# Patient Record
Sex: Female | Born: 1990 | Race: Black or African American | Hispanic: No | Marital: Single | Smoking: Former smoker
Health system: Southern US, Community
[De-identification: ages and names within clinical notes are randomized; demographics above are authoritative.]

## PROBLEM LIST (undated history)

## (undated) DIAGNOSIS — Z8619 Personal history of other infectious and parasitic diseases: Secondary | ICD-10-CM

## (undated) DIAGNOSIS — D649 Anemia, unspecified: Secondary | ICD-10-CM

## (undated) HISTORY — DX: Personal history of other infectious and parasitic diseases: Z86.19

## (undated) HISTORY — DX: Anemia, unspecified: D64.9

---

## 2017-08-06 DIAGNOSIS — Z8619 Personal history of other infectious and parasitic diseases: Secondary | ICD-10-CM

## 2017-08-06 HISTORY — PX: THERAPEUTIC ABORTION: SHX798

## 2017-08-06 HISTORY — DX: Personal history of other infectious and parasitic diseases: Z86.19

## 2017-08-14 ENCOUNTER — Encounter (HOSPITAL_COMMUNITY): Payer: Self-pay

## 2017-08-14 ENCOUNTER — Emergency Department (HOSPITAL_COMMUNITY): Payer: BLUE CROSS/BLUE SHIELD

## 2017-08-14 ENCOUNTER — Emergency Department (HOSPITAL_COMMUNITY)
Admission: EM | Admit: 2017-08-14 | Discharge: 2017-08-14 | Disposition: A | Discharge status: Home / Self Care | Payer: BLUE CROSS/BLUE SHIELD | Attending: Emergency Medicine | Admitting: Emergency Medicine

## 2017-08-14 DIAGNOSIS — O2341 Unspecified infection of urinary tract in pregnancy, first trimester: Secondary | ICD-10-CM

## 2017-08-14 DIAGNOSIS — N390 Urinary tract infection, site not specified: Secondary | ICD-10-CM

## 2017-08-14 DIAGNOSIS — Z3A09 9 weeks gestation of pregnancy: Secondary | ICD-10-CM | POA: Insufficient documentation

## 2017-08-14 DIAGNOSIS — R1030 Lower abdominal pain, unspecified: Secondary | ICD-10-CM | POA: Diagnosis present

## 2017-08-14 LAB — URINALYSIS, ROUTINE W REFLEX MICROSCOPIC
Bilirubin Urine: NEGATIVE
Glucose, UA: NEGATIVE mg/dL
Hgb urine dipstick: NEGATIVE
Ketones, ur: 80 mg/dL — AB
Nitrite: POSITIVE — AB
PH: 5 (ref 5.0–8.0)
Protein, ur: NEGATIVE mg/dL
SPECIFIC GRAVITY, URINE: 1.025 (ref 1.005–1.030)

## 2017-08-14 LAB — COMPREHENSIVE METABOLIC PANEL WITH GFR
ALT: 15 U/L (ref 14–54)
AST: 19 U/L (ref 15–41)
Albumin: 3.6 g/dL (ref 3.5–5.0)
Alkaline Phosphatase: 49 U/L (ref 38–126)
Anion gap: 7 (ref 5–15)
BUN: 10 mg/dL (ref 6–20)
CO2: 20 mmol/L — ABNORMAL LOW (ref 22–32)
Calcium: 8.9 mg/dL (ref 8.9–10.3)
Chloride: 110 mmol/L (ref 101–111)
Creatinine, Ser: 0.51 mg/dL (ref 0.44–1.00)
GFR calc Af Amer: >60 mL/min
GFR calc non Af Amer: >60 mL/min
Glucose, Bld: 75 mg/dL (ref 65–99)
Potassium: 3.5 mmol/L (ref 3.5–5.1)
Sodium: 137 mmol/L (ref 135–145)
Total Bilirubin: 0.2 mg/dL — ABNORMAL LOW (ref 0.3–1.2)
Total Protein: 7.4 g/dL (ref 6.5–8.1)

## 2017-08-14 LAB — CBC
HEMATOCRIT: 34 % — AB (ref 36.0–46.0)
Hemoglobin: 11.7 g/dL — ABNORMAL LOW (ref 12.0–15.0)
MCH: 32.9 pg (ref 26.0–34.0)
MCHC: 34.4 g/dL (ref 30.0–36.0)
MCV: 95.5 fL (ref 78.0–100.0)
PLATELETS: 284 10*3/uL (ref 150–400)
RBC: 3.56 MIL/uL — ABNORMAL LOW (ref 3.87–5.11)
RDW: 12.7 % (ref 11.5–15.5)
WBC: 7.3 10*3/uL (ref 4.0–10.5)

## 2017-08-14 LAB — WET PREP, GENITAL
Sperm: NONE SEEN
Trich, Wet Prep: NONE SEEN
Yeast Wet Prep HPF POC: NONE SEEN

## 2017-08-14 LAB — HCG, QUANTITATIVE, PREGNANCY: hCG, Beta Chain, Quant, S: 266960 m[IU]/mL — ABNORMAL HIGH

## 2017-08-14 LAB — LIPASE, BLOOD: Lipase: 25 U/L (ref 11–51)

## 2017-08-14 LAB — ABO/RH: ABO/RH(D): O POS

## 2017-08-14 MED ORDER — CEPHALEXIN 500 MG PO CAPS
500.0000 mg | ORAL_CAPSULE | Freq: Once | ORAL | Status: AC
  Administered 2017-08-14: 500 mg via ORAL
  Filled 2017-08-14: qty 1

## 2017-08-14 MED ORDER — CEPHALEXIN 500 MG PO CAPS: 500.0000 mg | ORAL_CAPSULE | Freq: Two times a day (BID) | ORAL | 0 refills | Status: AC

## 2017-08-14 NOTE — ED Provider Notes (Signed)
[facility]-EMERGENCY DEPT Provider Note   CSN: [id number] Arrival date & time: 08/14/17  1204     History   Chief Complaint Chief Complaint  Patient presents with  . Abdominal Pain    HPI Christine Martin is a 27 y.o. female who presents today for evaluation of abdominal pain.  She reports that her pain began last night when she was sitting on the couch.  She denies any recent trauma.  She took a home pregnancy test that was of, and went to urgent care yesterday where she reportedly had another positive.  She reports that her last period was approximately 1 week before Christmas.  She reports that her abdominal pain is intermittent, and is currently feeling better.  Her pain is below her bellybutton.  She has not found anything that makes it better or worse.  She reports that she had to leave work today because of the pain.  Denies nausea, vomiting, diarrhea, no vaginal bleeding.  No recent fevers or chills, denies dysuria, hematuria, increased urgency, or frequency.  HPI  History reviewed. No pertinent past medical history.  There are no active problems to display for this patient.   History reviewed. No pertinent surgical history.  OB History    Gravida Para Term Preterm AB Living   1             SAB TAB Ectopic Multiple Live Births                   Home Medications    Prior to Admission medications   Medication Sig Start Date End Date Taking? Authorizing Provider  cephALEXin (KEFLEX) 500 MG capsule Take 1 capsule (500 mg total) by mouth 2 (two) times daily for 7 days. 08/14/17 08/21/17  Cristina Gong, PA-C    Family History No family history on file.  Social History Social History   Tobacco Use  . Smoking status: Never Smoker  . Smokeless tobacco: Never Used  Substance Use Topics  . Alcohol use: Yes    Frequency: Never    Comment: social  . Drug use: No     Allergies   Patient has no known allergies.   Review of Systems Review  of Systems  Constitutional: Negative for chills and fever.  Gastrointestinal: Positive for abdominal pain. Negative for abdominal distention, diarrhea, nausea and vomiting.  Genitourinary: Negative for difficulty urinating, dysuria, flank pain, frequency, urgency, vaginal bleeding, vaginal discharge and vaginal pain.  Neurological: Negative for headaches.  All other systems reviewed and are negative.    Physical Exam Updated Vital Signs BP 123/78   Pulse 97   Temp 98.3 F (36.8 C) (Oral)   Resp 18   Ht 5' (1.524 m)   Wt 49.9 kg (110 lb)   LMP  (LMP Unknown)   SpO2 100%   BMI 21.48 kg/m   Physical Exam  Constitutional: She appears well-developed and well-nourished. No distress.  HENT:  Head: Normocephalic and atraumatic.  Mouth/Throat: Oropharynx is clear and moist.  Eyes: Conjunctivae are normal.  Neck: Neck supple.  Cardiovascular: Normal rate and regular rhythm.  No murmur heard. Pulmonary/Chest: Effort normal and breath sounds normal. No respiratory distress.  Abdominal: Soft. Normal appearance and bowel sounds are normal. She exhibits no distension and no mass. There is no tenderness. There is no rigidity, no rebound, no guarding and no CVA tenderness.  Uterus not palpable above the pelvic brim.   Genitourinary: Uterus is enlarged. Uterus is not tender. Cervix  exhibits no motion tenderness, no discharge and no friability. Right adnexum displays no mass, no tenderness and no fullness. Left adnexum displays no mass, no tenderness and no fullness.  Musculoskeletal: She exhibits no edema.  Neurological: She is alert.  Skin: Skin is warm and dry.  Psychiatric: She has a normal mood and affect.  Nursing note and vitals reviewed.    ED Treatments / Results  Labs (all labs ordered are listed, but only abnormal results are displayed) Labs Reviewed  WET PREP, GENITAL - Abnormal; Notable for the following components:      Result Value   Clue Cells Wet Prep HPF POC PRESENT  (*)    WBC, Wet Prep HPF POC MANY (*)    All other components within normal limits  COMPREHENSIVE METABOLIC PANEL - Abnormal; Notable for the following components:   CO2 20 (*)    Total Bilirubin 0.2 (*)    All other components within normal limits  CBC - Abnormal; Notable for the following components:   RBC 3.56 (*)    Hemoglobin 11.7 (*)    HCT 34.0 (*)    All other components within normal limits  URINALYSIS, ROUTINE W REFLEX MICROSCOPIC - Abnormal; Notable for the following components:   Color, Urine AMBER (*)    APPearance CLOUDY (*)    Ketones, ur 80 (*)    Nitrite POSITIVE (*)    Leukocytes, UA SMALL (*)    Bacteria, UA MANY (*)    Squamous Epithelial / LPF 6-30 (*)    All other components within normal limits  HCG, QUANTITATIVE, PREGNANCY - Abnormal; Notable for the following components:   hCG, Beta Chain, Quant, S 266,960 (*)    All other components within normal limits  URINE CULTURE  LIPASE, BLOOD  HIV ANTIBODY (ROUTINE TESTING)  ABO/RH  GC/CHLAMYDIA PROBE AMP ([place]) NOT AT [facility]    EKG  EKG Interpretation None       Radiology US Ob Comp < 14 Wks  Result Date: 08/14/2017 CLINICAL DATA:  Pelvic pain EXAM: OBSTETRIC <14 WK ULTRASOUND TECHNIQUE: Transabdominal ultrasound was performed for evaluation of the gestation as well as the maternal uterus and adnexal regions. COMPARISON:  None. FINDINGS: Intrauterine gestational sac: Visualized Yolk sac:  Visualized Embryo:  Visualized Cardiac Activity: Visualized Heart Rate: 178 bpm CRL:   28 mm   9 w 4 d                  [place] EDC: March 15, 2018 Subchorionic hemorrhage:  None visualized. Maternal uterus/adnexae: Cervical os appears closed. Right ovary appears normal. Left ovary is not appreciable. There is no extrauterine pelvic or adnexal mass evident. No free pelvic fluid. IMPRESSION: Single live intrauterine gestation with estimated gestational age of 9+ weeks. No subchorionic hemorrhage evident. Left ovary could  not be visualized. Maternal extrauterine pelvic structures otherwise appear normal. Electronically Signed   By: Bretta Bang III M.D.   On: 08/14/2017 21:07    Procedures Procedures (including critical care time)  Medications Ordered in ED Medications  cephALEXin (KEFLEX) capsule 500 mg (500 mg Oral Given 08/14/17 2251)     Initial Impression / Assessment and Plan / ED Course  I have reviewed the triage vital signs and the nursing notes.  Pertinent labs & imaging results that were available during my care of the patient were reviewed by me and considered in my medical decision making (see chart for details).  Clinical Course as of Aug 16 1435  Sat Aug 14, 2017  1810 Attempted to see patient, in bathroom.   [EH]  2004 Spoke with lab, reports hcg is positive, waiting to see final number.   [EH]  2010 Lab reports that number is very high, will need to run dilution on hcg   [EH]    Clinical Course User Index [EH] Cristina GongHammond, Jeannifer Drakeford W, PA-C   Nadeen LandauKiara Cousins presents today for evaluation of abdominal pain.  She found out yesterday that she is pregnant.  She has not had any imaging, or lab work obtained, no history of ultrasounds confirming intrauterine gestation.  Labs were obtained and reviewed.  Ultrasound was ordered which showed a single gestation intrauterine pregnancy with no significant pelvic free fluid.  UA obtained consistent with urinary tract infection, nitrite positive.  Pelvic exam was performed, no adnexal tenderness, wet prep obtained, swabs for gonorrhea chlamydia were taken.  Patient has BV, however is asymptomatic, as she is pregnant after discussion with Dr. Donnald GarrePfeiffer will not treat BV.  Patient given her first dose of Keflex in the emergency room, along with prescription to fill at home.  Urine culture ordered.  She is afebrile, no CVA tenderness, I do not suspect pyelonephritis.  She was counseled on dietary and life style advice for pregnancy, given information on first  trimester pregnancy, along with urinary tract infection and pregnancy.  She was given the option twice questions, all of which were answered to the best my ability.  She does not have any vaginal spotting or bleeding.  She was given strict return precautions, and stated her understanding.  She was given OB/GYN follow-up with the women's clinic.  Given UTI with normal pelvic ultrasound in pregnancy with hemodynamic stability and reassuring labs additional work up is not indicated.     At this time there does not appear to be any evidence of an acute emergency medical condition and the patient appears stable for discharge with appropriate outpatient follow up.Diagnosis was discussed with patient who verbalizes understanding and is agreeable to discharge.    Final Clinical Impressions(s) / ED Diagnoses   Final diagnoses:  Lower abdominal pain  Lower urinary tract infectious disease  Urinary tract infection in mother during first trimester of pregnancy  [redacted] weeks gestation of pregnancy    ED Discharge Orders        Ordered    cephALEXin (KEFLEX) 500 MG capsule  2 times daily     08/14/17 2206       Cristina GongHammond, Vernida Mcnicholas W, [place]PA-C 08/15/17 1437    Arby BarrettePfeiffer, Marcy, MD 08/15/17 1732

## 2017-08-14 NOTE — Discharge Instructions (Signed)
You may have diarrhea from the antibiotics.  It is very important that you continue to take the antibiotics even if you get diarrhea unless a medical professional tells you that you may stop taking them.  If you stop too early the bacteria you are being treated for will become stronger and you may need different, more powerful antibiotics that have more side effects and worsening diarrhea.  Please stay well hydrated and consider probiotics as they may decrease the severity of your diarrhea.    You have been tested for gonorrhea and chlamydia today.  If those results come back positive you will need treatment.  You may follow up with the health department or your OB/GYN.    Please make sure that you do not drink any alcohol, start taking prenatal vitamins.

## 2017-08-14 NOTE — ED Notes (Addendum)
Pt had drawn for labs:  Red  Blue Gold Lavender Lt green  Dark green

## 2017-08-14 NOTE — ED Triage Notes (Addendum)
Pt with abdominal pain x 3 days.  No n/v/d.  No fever.  No change in urination.  Pt states found out from urgent care yesterday that she is pregnant.  No period since December.  No vaginal bleeding.  Did not do labs or testing yesterday.

## 2017-08-15 ENCOUNTER — Encounter (HOSPITAL_COMMUNITY): Payer: Self-pay | Admitting: Physician Assistant

## 2017-08-15 LAB — HIV ANTIBODY (ROUTINE TESTING W REFLEX): HIV Screen 4th Generation wRfx: NONREACTIVE

## 2017-08-16 ENCOUNTER — Telehealth: Payer: Self-pay | Admitting: Student

## 2017-08-16 DIAGNOSIS — A749 Chlamydial infection, unspecified: Secondary | ICD-10-CM

## 2017-08-16 LAB — GC/CHLAMYDIA PROBE AMP (~~LOC~~) NOT AT ARMC
Chlamydia: POSITIVE — AB
Neisseria Gonorrhea: NEGATIVE

## 2017-08-16 MED ORDER — AZITHROMYCIN 500 MG PO TABS: 1000.0000 mg | ORAL_TABLET | Freq: Once | ORAL | 0 refills | Status: AC

## 2017-08-16 NOTE — Telephone Encounter (Addendum)
Christine LandauKiara Wrobleski tested positive for  Chlamydia. Patient was called by RN and allergies and pharmacy confirmed. Rx sent to pharmacy of choice.   Judeth HornLawrence, Sharlie Shreffler, NP 08/16/2017 3:12 PM       ----- Message from Kathe BectonLori S Berdik, RN sent at 08/16/2017  2:56 PM EST ----- This patient tested positive for :  Chlamydia  She ::"has NKDA", I have informed the patient of her results and confirmed her pharmacy is correct in her chart. Please send Rx.   Thank you,   Kathe BectonBerdik, Lori S, RN   [facility]

## 2017-08-17 LAB — URINE CULTURE: Culture: >=100000 — AB

## 2017-08-18 ENCOUNTER — Telehealth: Payer: Self-pay | Admitting: Emergency Medicine

## 2017-08-18 NOTE — Telephone Encounter (Signed)
Post ED Visit - Positive Culture Follow-up  Culture report reviewed by antimicrobial stewardship pharmacist:  []  Enzo BiNathan Batchelder, Pharm.D. []  Celedonio MiyamotoJeremy Frens, Pharm.D., BCPS AQ-ID []  Garvin FilaMike Maccia, Pharm.D., BCPS [x]  Georgina PillionElizabeth Martin, Pharm.D., BCPS []  IvinsMinh Pham, [address]Pharm.D., BCPS, AAHIVP []  Estella HuskMichelle Turner, Pharm.D., BCPS, AAHIVP []  Lysle Pearlachel Rumbarger, PharmD, BCPS []  Blake DivineShannon Parkey, PharmD []  Pollyann SamplesAndy Johnston, PharmD, BCPS  Positive urine culture Treated with cephalexin, organism sensitive to the same and no further patient follow-up is required at this time.  Berle MullMiller, Lodema Parma 08/18/2017, 10:51 AM

## 2017-12-28 ENCOUNTER — Other Ambulatory Visit: Payer: Self-pay

## 2017-12-28 ENCOUNTER — Encounter (HOSPITAL_COMMUNITY): Payer: Self-pay

## 2017-12-28 ENCOUNTER — Emergency Department (HOSPITAL_COMMUNITY)
Admission: EM | Admit: 2017-12-28 | Discharge: 2017-12-28 | Disposition: A | Discharge status: Home / Self Care | Payer: Self-pay | Attending: Emergency Medicine | Admitting: Emergency Medicine

## 2017-12-28 ENCOUNTER — Emergency Department (HOSPITAL_COMMUNITY): Payer: Self-pay

## 2017-12-28 DIAGNOSIS — R1030 Lower abdominal pain, unspecified: Secondary | ICD-10-CM | POA: Insufficient documentation

## 2017-12-28 DIAGNOSIS — R102 Pelvic and perineal pain: Secondary | ICD-10-CM | POA: Insufficient documentation

## 2017-12-28 DIAGNOSIS — Z3491 Encounter for supervision of normal pregnancy, unspecified, first trimester: Secondary | ICD-10-CM

## 2017-12-28 DIAGNOSIS — Z3201 Encounter for pregnancy test, result positive: Secondary | ICD-10-CM | POA: Insufficient documentation

## 2017-12-28 DIAGNOSIS — R350 Frequency of micturition: Secondary | ICD-10-CM | POA: Insufficient documentation

## 2017-12-28 DIAGNOSIS — O9989 Other specified diseases and conditions complicating pregnancy, childbirth and the puerperium: Secondary | ICD-10-CM | POA: Insufficient documentation

## 2017-12-28 DIAGNOSIS — Z3A01 Less than 8 weeks gestation of pregnancy: Secondary | ICD-10-CM | POA: Insufficient documentation

## 2017-12-28 LAB — COMPREHENSIVE METABOLIC PANEL
ALT: 14 U/L (ref 0–44)
ANION GAP: 9 (ref 5–15)
AST: 29 U/L (ref 15–41)
Albumin: 4.5 g/dL (ref 3.5–5.0)
Alkaline Phosphatase: 49 U/L (ref 38–126)
BILIRUBIN TOTAL: 2.3 mg/dL — AB (ref 0.3–1.2)
BUN: 8 mg/dL (ref 6–20)
CALCIUM: 9.5 mg/dL (ref 8.9–10.3)
CO2: 23 mmol/L (ref 22–32)
Chloride: 106 mmol/L (ref 98–111)
Creatinine, Ser: 0.53 mg/dL (ref 0.44–1.00)
GFR calc non Af Amer: >60 mL/min (ref >60)
Glucose, Bld: 82 mg/dL (ref 70–99)
Potassium: 4.3 mmol/L (ref 3.5–5.1)
SODIUM: 138 mmol/L (ref 135–145)
TOTAL PROTEIN: 8.5 g/dL — AB (ref 6.5–8.1)

## 2017-12-28 LAB — URINALYSIS, ROUTINE W REFLEX MICROSCOPIC
BACTERIA UA: NONE SEEN
Bilirubin Urine: NEGATIVE
Glucose, UA: NEGATIVE mg/dL
Ketones, ur: 20 mg/dL — AB
Leukocytes, UA: NEGATIVE
NITRITE: NEGATIVE
Protein, ur: NEGATIVE mg/dL
SPECIFIC GRAVITY, URINE: 1.012 (ref 1.005–1.030)
pH: 5 (ref 5.0–8.0)

## 2017-12-28 LAB — CBC
HEMATOCRIT: 40.9 % (ref 36.0–46.0)
Hemoglobin: 14.2 g/dL (ref 12.0–15.0)
MCH: 33.3 pg (ref 26.0–34.0)
MCHC: 34.7 g/dL (ref 30.0–36.0)
MCV: 96 fL (ref 78.0–100.0)
Platelets: 343 10*3/uL (ref 150–400)
RBC: 4.26 MIL/uL (ref 3.87–5.11)
RDW: 12.6 % (ref 11.5–15.5)
WBC: 6 10*3/uL (ref 4.0–10.5)

## 2017-12-28 LAB — WET PREP, GENITAL
CLUE CELLS WET PREP: NONE SEEN
SPERM: NONE SEEN
TRICH WET PREP: NONE SEEN
YEAST WET PREP: NONE SEEN

## 2017-12-28 LAB — LIPASE, BLOOD: Lipase: 23 U/L (ref 11–51)

## 2017-12-28 LAB — ABO/RH: ABO/RH(D): O POS

## 2017-12-28 LAB — HCG, QUANTITATIVE, PREGNANCY: hCG, Beta Chain, Quant, S: 57739 m[IU]/mL — ABNORMAL HIGH (ref <5)

## 2017-12-28 NOTE — ED Provider Notes (Signed)
[facility]-EMERGENC[facility] DEPT Provider Note   CSN: [id number] Arrival date & time: 12/28/17  4098     History   Chief Complaint Chief Complaint  Patient presents with  . Abdominal Pain  . Urinary Frequency    HPI Christine Martin is a 27 y.o. female.  27 year old female who presents with lower abdominal pain and urinary frequency.  For the past 2 days, she has had an intermittent lower abdominal pain that radiates to her lower back as well as increased urinary frequency.  She denies any dysuria or hematuria.  She had some mild vaginal spotting a few days ago.  Last menstrual period was at the first week of May.  She took a home pregnancy test yesterday that was positive.  She denies any vaginal discharge.  She had an episode of vomiting last week but no nausea or vomiting recently.  No cough/cold symptoms.  She currently uses marijuana occasionally.  She started taking prenatal vitamins a few days ago.  The history is provided by the patient.  Abdominal Pain   Associated symptoms include frequency.  Urinary Frequency  Associated symptoms include abdominal pain.    History reviewed. No pertinent past medical history.  There are no active problems to display for this patient.   History reviewed. No pertinent surgical history.   OB History    Gravida  1   Para      Term      Preterm      AB      Living        SAB      TAB      Ectopic      Multiple      Live Births               Home Medications    Prior to Admission medications   Medication Sig Start Date End Date Taking? Authorizing Provider  Prenatal Vit-Fe Fumarate-FA (PRENATAL MULTIVITAMIN) TABS tablet Take 1 tablet by mouth daily.   Yes [provider]    Family History History reviewed. No pertinent family history.  Social History Social History   Tobacco Use  . Smoking status: Never Smoker  . Smokeless tobacco: Never Used  Substance Use Topics  . Alcohol  use: Yes    Frequency: Never    Comment: social  . Drug use: No     Allergies   Patient has no known allergies.   Review of Systems Review of Systems  Gastrointestinal: Positive for abdominal pain.  Genitourinary: Positive for frequency.   All other systems reviewed and are negative except that which was mentioned in HPI   Physical Exam Updated Vital Signs BP 120/79 (BP Location: Left Arm)   Pulse 76   Temp 98.1 F (36.7 C) (Oral)   Resp 14   Ht 5' (1.524 m)   Wt 49.6 kg (109 lb 6 oz)   LMP 11/06/2017 (Approximate)   SpO2 100%   BMI 21.36 kg/m   Physical Exam  Constitutional: She is oriented to person, place, and time. She appears well-developed and well-nourished. No distress.  HENT:  Head: Normocephalic and atraumatic.  Moist mucous membranes  Eyes: Conjunctivae are normal.  Neck: Neck supple.  Cardiovascular: Normal rate, regular rhythm and normal heart sounds.  No murmur heard. Pulmonary/Chest: Effort normal and breath sounds normal.  Abdominal: Soft. Bowel sounds are normal. She exhibits no distension. There is no tenderness.  Genitourinary: Cervix exhibits friability. Cervix exhibits no motion tenderness. Right  adnexum displays no tenderness. Left adnexum displays no tenderness. No tenderness or bleeding in the vagina. Vaginal discharge found.  Genitourinary Comments: Small amount of white discharge in vaginal vault, mildly friable cervix; no adnexal or cervical motion tenderness  Musculoskeletal: She exhibits no edema.  Neurological: She is alert and oriented to person, place, and time.  Fluent speech  Skin: Skin is warm and dry.  Psychiatric: She has a normal mood and affect. Judgment normal.  Nursing note and vitals reviewed.    ED Treatments / Results  Labs (all labs ordered are listed, but only abnormal results are displayed) Labs Reviewed  WET PREP, GENITAL - Abnormal; Notable for the following components:      Result Value   WBC, Wet Prep HPF  POC MANY (*)    All other components within normal limits  COMPREHENSIVE METABOLIC PANEL - Abnormal; Notable for the following components:   Total Protein 8.5 (*)    Total Bilirubin 2.3 (*)    All other components within normal limits  URINALYSIS, ROUTINE W REFLEX MICROSCOPIC - Abnormal; Notable for the following components:   Hgb urine dipstick SMALL (*)    Ketones, ur 20 (*)    All other components within normal limits  HCG, QUANTITATIVE, PREGNANCY - Abnormal; Notable for the following components:   hCG, Beta Chain, Quant, S 57,739 (*)    All other components within normal limits  URINE CULTURE  LIPASE, BLOOD  CBC  ABO/RH  GC/CHLAMYDIA PROBE AMP ([place]) NOT AT [facility]    EKG None  Radiology US Ob Comp < 14 Wks  Result Date: 12/28/2017 CLINICAL DATA:  Abdominal pain.  Positive pregnancy test. EXAM: OBSTETRIC <14 WK [place] AND TRANSVAGINAL OB US TECHNIQUE: Both transabdominal and transvaginal ultrasound examinations were performed for complete evaluation of the gestation as well as the maternal uterus, adnexal regions, and pelvic cul-de-sac. Transvaginal technique was performed to assess early pregnancy. COMPARISON:  08/14/2017. FINDINGS: Intrauterine gestational sac: Single Yolk sac:  Present Embryo:  Present Cardiac Activity: Present Heart Rate: 116 bpm CRL: 7.0 mm   6 w   4 d                  [place] EDC: 08/19/2018 Subchorionic hemorrhage:  None visualized. Maternal uterus/adnexae: 2.0 cm complex cyst right ovary most likely corpus luteal cyst. Small amount of free pelvic fluid. IMPRESSION: Single viable injury pregnancy at 6 weeks 4 days. Electronically Signed   By: Maisie Fus  Register   On: 12/28/2017 13:39   US Ob Transvaginal  Result Date: 12/28/2017 CLINICAL DATA:  Abdominal pain.  Positive pregnancy test. EXAM: OBSTETRIC <14 WK [place] AND TRANSVAGINAL OB US TECHNIQUE: Both transabdominal and transvaginal ultrasound examinations were performed for complete evaluation of the gestation as  well as the maternal uterus, adnexal regions, and pelvic cul-de-sac. Transvaginal technique was performed to assess early pregnancy. COMPARISON:  08/14/2017. FINDINGS: Intrauterine gestational sac: Single Yolk sac:  Present Embryo:  Present Cardiac Activity: Present Heart Rate: 116 bpm CRL: 7.0 mm   6 w   4 d                  [place] EDC: 08/19/2018 Subchorionic hemorrhage:  None visualized. Maternal uterus/adnexae: 2.0 cm complex cyst right ovary most likely corpus luteal cyst. Small amount of free pelvic fluid. IMPRESSION: Single viable injury pregnancy at 6 weeks 4 days. Electronically Signed   By: Maisie Fus  Register   On: 12/28/2017 13:39    Procedures Procedures (including critical care time)  Medications Ordered in ED Medications - No data to display   Initial Impression / Assessment and Plan / ED Course  I have reviewed the triage vital signs and the nursing notes.  Pertinent labs & imaging results that were available during my care of the patient were reviewed by me and considered in my medical decision making (see chart for details).    Well appearing on exam. UA negative for infection. No focal abd tenderness. Blood type O+, beta hCG 57,000, wet prep negative.  Obtained ultrasound to rule out ectopic pregnancy.  Ultrasound shows intrauterine pregnancy at 6 weeks and 4 days, no abnormal findings.  Counseled the patient on routine prenatal care including tylenol only for pain, avoidance of call/tobacco/drugs, daily prenatal vitamins, f/u with OBGYN to establish care. Extensively reviewed return precautions and she voiced understanding.  Final Clinical Impressions(s) / ED Diagnoses   Final diagnoses:  First trimester pregnancy  Urinary frequency  Lower abdominal pain    ED Discharge Orders    None       Esmay Amspacher, Ambrose Finlandachel Morgan, MD 12/28/17 1423

## 2017-12-28 NOTE — ED Triage Notes (Signed)
Patient c/o lower abdominal pain and urinary frequency x 2 days ago. Patient states the pain radiates into her back.  Patient also added that she had a + home pregnancy test yesterday.

## 2017-12-28 NOTE — Discharge Instructions (Addendum)
[facility] if you have heavy vaginal bleeding, severe abdominal pain, or fever.

## 2017-12-29 ENCOUNTER — Telehealth (HOSPITAL_BASED_OUTPATIENT_CLINIC_OR_DEPARTMENT_OTHER): Payer: Self-pay

## 2017-12-29 LAB — GC/CHLAMYDIA PROBE AMP (~~LOC~~) NOT AT ARMC
Chlamydia: NEGATIVE
NEISSERIA GONORRHEA: NEGATIVE

## 2017-12-29 LAB — URINE CULTURE
Culture: 10000 — AB
Special Requests: NORMAL

## 2017-12-30 ENCOUNTER — Telehealth: Payer: Self-pay | Admitting: *Deleted

## 2017-12-30 NOTE — Telephone Encounter (Signed)
Post ED Visit - Positive Culture Follow-up  Culture report reviewed by antimicrobial stewardship pharmacist:  []  Enzo BiNathan Batchelder, Pharm.D. []  Celedonio MiyamotoJeremy Frens, Pharm.D., BCPS AQ-ID []  Garvin FilaMike Maccia, Pharm.D., BCPS []  Georgina PillionElizabeth Martin, Pharm.D., BCPS []  SalisburyMinh Pham, [address]Pharm.D., BCPS, AAHIVP []  Estella HuskMichelle Turner, Pharm.D., BCPS, AAHIVP []  Lysle Pearlachel Rumbarger, PharmD, BCPS []  Sherlynn CarbonAustin Lucas, PharmD []  Pollyann SamplesAndy Johnston, PharmD, BCPS  Positive urine culture Reviewed by Alveria ApleySophia Caccavale and no further patient follow-up is required at this time.  Virl AxeRobertson, Gerson Fauth [facility] 12/30/2017, 11:35 AM

## 2018-01-30 ENCOUNTER — Encounter: Payer: Self-pay | Admitting: Student

## 2018-01-31 ENCOUNTER — Encounter: Payer: Self-pay | Admitting: Student

## 2018-01-31 ENCOUNTER — Ambulatory Visit (INDEPENDENT_AMBULATORY_CARE_PROVIDER_SITE_OTHER): Payer: Self-pay | Admitting: Student

## 2018-01-31 ENCOUNTER — Ambulatory Visit: Payer: Self-pay | Admitting: Clinical

## 2018-01-31 DIAGNOSIS — Z3490 Encounter for supervision of normal pregnancy, unspecified, unspecified trimester: Secondary | ICD-10-CM

## 2018-01-31 DIAGNOSIS — Z113 Encounter for screening for infections with a predominantly sexual mode of transmission: Secondary | ICD-10-CM

## 2018-01-31 DIAGNOSIS — R8271 Bacteriuria: Secondary | ICD-10-CM

## 2018-01-31 DIAGNOSIS — Z3481 Encounter for supervision of other normal pregnancy, first trimester: Secondary | ICD-10-CM

## 2018-01-31 DIAGNOSIS — Z124 Encounter for screening for malignant neoplasm of cervix: Secondary | ICD-10-CM

## 2018-01-31 LAB — POCT URINALYSIS DIP (DEVICE)
BILIRUBIN URINE: NEGATIVE
Glucose, UA: NEGATIVE mg/dL
LEUKOCYTES UA: NEGATIVE
Nitrite: NEGATIVE
Protein, ur: NEGATIVE mg/dL
Urobilinogen, UA: 0.2 mg/dL (ref 0.0–1.0)
pH: 5.5 (ref 5.0–8.0)

## 2018-01-31 NOTE — Progress Notes (Signed)
Anatomy US scheduled September 19th @ 1015.  Pt notified.

## 2018-01-31 NOTE — Patient Instructions (Signed)
Eating Plan for Pregnant Women While you are pregnant, your body will require additional nutrition to help support your growing baby. It is recommended that you consume:  150 additional calories each day during your first trimester.  300 additional calories each day during your second trimester.  300 additional calories each day during your third trimester.  Eating a healthy, well-balanced diet is very important for your health and for your baby's health. You also have a higher need for some vitamins and minerals, such as folic acid, calcium, iron, and vitamin D. What do I need to know about eating during pregnancy?  Do not try to lose weight or go on a diet during pregnancy.  Choose healthy, nutritious foods. Choose  of a sandwich with a glass of milk instead of a candy bar or a high-calorie sugar-sweetened beverage.  Limit your overall intake of foods that have "empty calories." These are foods that have little nutritional value, such as sweets, desserts, candies, sugar-sweetened beverages, and fried foods.  Eat a variety of foods, especially fruits and vegetables.  Take a prenatal vitamin to help meet the additional needs during pregnancy, specifically for folic acid, iron, calcium, and vitamin D.  Remember to stay active. Ask your health care provider for exercise recommendations that are specific to you.  Practice good food safety and cleanliness, such as washing your hands before you eat and after you prepare raw meat. This helps to prevent foodborne illnesses, such as listeriosis, that can be very dangerous for your baby. Ask your health care provider for more information about listeriosis. What does 150 extra calories look like? Healthy options for an additional 150 calories each day could be any of the following:  Plain low-fat yogurt (6-8 oz) with  cup of berries.  1 apple with 2 teaspoons of peanut butter.  Cut-up vegetables with  cup of hummus.  Low-fat chocolate milk  (8 oz or 1 cup).  1 string cheese with 1 medium orange.   of a peanut butter and jelly sandwich on whole-wheat bread (1 tsp of peanut butter).  For 300 calories, you could eat two of those healthy options each day. What is a healthy amount of weight to gain? The recommended amount of weight for you to gain is based on your pre-pregnancy BMI. If your pre-pregnancy BMI was:  Less than 18 (underweight), you should gain 28-40 lb.  18-24.9 (normal), you should gain 25-35 lb.  25-29.9 (overweight), you should gain 15-25 lb.  Greater than 30 (obese), you should gain 11-20 lb.  What if I am having twins or multiples? Generally, pregnant women who will be having twins or multiples may need to increase their daily calories by 300-600 calories each day. The recommended range for total weight gain is 25-54 lb, depending on your pre-pregnancy BMI. Talk with your health care provider for specific guidance about additional nutritional needs, weight gain, and exercise during your pregnancy. What foods can I eat? Grains Any grains. Try to choose whole grains, such as whole-wheat bread, oatmeal, or brown rice. Vegetables Any vegetables. Try to eat a variety of colors and types of vegetables to get a full range of vitamins and minerals. Remember to wash your vegetables well before eating. Fruits Any fruits. Try to eat a variety of colors and types of fruit to get a full range of vitamins and minerals. Remember to wash your fruits well before eating. Meats and Other Protein Sources Lean meats, including chicken, [place], fish, and lean cuts of beef, veal,  or pork. Make sure that all meats are cooked to "well done." Tofu. Tempeh. Beans. Eggs. Peanut butter and other nut butters. Seafood, such as shrimp, crab, and lobster. If you choose fish, select types that are higher in omega-3 fatty acids, including salmon, herring, mussels, trout, sardines, and pollock. Make sure that all meats are cooked to food-safe  temperatures. Dairy Pasteurized milk and milk alternatives. Pasteurized yogurt and pasteurized cheese. Cottage cheese. Sour cream. Beverages Water. Juices that contain 100% fruit juice or vegetable juice. Caffeine-free teas and decaffeinated coffee. Drinks that contain caffeine are okay to drink, but it is better to avoid caffeine. Keep your total caffeine intake to less than 200 mg each day (12 oz of coffee, tea, or soda) or as directed by your health care provider. Condiments Any pasteurized condiments. Sweets and Desserts Any sweets and desserts. Fats and Oils Any fats and oils. The items listed above may not be a complete list of recommended foods or beverages. Contact your dietitian for more options. What foods are not recommended? Vegetables Unpasteurized (raw) vegetable juices. Fruits Unpasteurized (raw) fruit juices. Meats and Other Protein Sources Cured meats that have nitrates, such as bacon, salami, and hotdogs. Luncheon meats, bologna, or other deli meats (unless they are reheated until they are steaming hot). Refrigerated pate, meat spreads from a meat counter, smoked seafood that is found in the refrigerated section of a store. Raw fish, such as sushi or sashimi. High mercury content fish, such as tilefish, shark, swordfish, and king mackerel. Raw meats, such as tuna or beef tartare. Undercooked meats and poultry. Make sure that all meats are cooked to food-safe temperatures. Dairy Unpasteurized (raw) milk and any foods that have raw milk in them. Soft cheeses, such as feta, queso blanco, queso fresco, Brie, Camembert cheeses, blue-veined cheeses, and Panela cheese (unless it is made with pasteurized milk, which must be stated on the label). Beverages Alcohol. Sugar-sweetened beverages, such as sodas, teas, or energy drinks. Condiments Homemade fermented foods and drinks, such as pickles, sauerkraut, or kombucha drinks. (Store-bought pasteurized versions of these are  okay.) Other Salads that are made in the store, such as ham salad, chicken salad, egg salad, tuna salad, and seafood salad. The items listed above may not be a complete list of foods and beverages to avoid. Contact your dietitian for more information. This information is not intended to replace advice given to you by your health care provider. Make sure you discuss any questions you have with your health care provider. Document Released: 04/06/2014 Document Revised: 11/28/2015 Document Reviewed: 12/05/2013 Elsevier Interactive Patient Education  2018 [employer].         First Trimester of Pregnancy The first trimester of pregnancy is from week 1 until the end of week 13 (months 1 through 3). During this time, your baby will begin to develop inside you. At 6-8 weeks, the eyes and face are formed, and the heartbeat can be seen on ultrasound. At the end of 12 weeks, all the baby's organs are formed. Prenatal care is all the medical care you receive before the birth of your baby. Make sure you get good prenatal care and follow all of your doctor's instructions. Follow these instructions at home: Medicines  Take over-the-counter and prescription medicines only as told by your doctor. Some medicines are safe and some medicines are not safe during pregnancy.  Take a prenatal vitamin that contains at least 600 micrograms (mcg) of folic acid.  If you have trouble pooping (constipation), take medicine  that will make your stool soft (stool softener) if your doctor approves. Eating and drinking  Eat regular, healthy meals.  Your doctor will tell you the amount of weight gain that is right for you.  Avoid raw meat and uncooked cheese.  If you feel sick to your stomach (nauseous) or throw up (vomit): ? Eat 4 or 5 small meals a day instead of 3 large meals. ? Try eating a few soda crackers. ? Drink liquids between meals instead of during meals.  To prevent constipation: ? Eat foods that  are high in fiber, like fresh fruits and vegetables, whole grains, and beans. ? Drink enough fluids to keep your pee (urine) clear or pale yellow. Activity  Exercise only as told by your doctor. Stop exercising if you have cramps or pain in your lower belly (abdomen) or low back.  Do not exercise if it is too hot, too humid, or if you are in a place of great height (high altitude).  Try to avoid standing for long periods of time. Move your legs often if you must stand in one place for a long time.  Avoid heavy lifting.  Wear low-heeled shoes. Sit and stand up straight.  You can have sex unless your doctor tells you not to. Relieving pain and discomfort  Wear a good support bra if your breasts are sore.  Take warm water baths (sitz baths) to soothe pain or discomfort caused by hemorrhoids. Use hemorrhoid cream if your doctor says it is okay.  Rest with your legs raised if you have leg cramps or low back pain.  If you have puffy, bulging veins (varicose veins) in your legs: ? Wear support hose or compression stockings as told by your doctor. ? Raise (elevate) your feet for 15 minutes, 3-4 times a day. ? Limit salt in your food. Prenatal care  Schedule your prenatal visits by the twelfth week of pregnancy.  Write down your questions. Take them to your prenatal visits.  Keep all your prenatal visits as told by your doctor. This is important. Safety  Wear your seat belt at all times when driving.  Make a list of emergency phone numbers. The list should include numbers for family, friends, the hospital, and police and fire departments. General instructions  Ask your doctor for a referral to a local prenatal class. Begin classes no later than at the start of month 6 of your pregnancy.  Ask for help if you need counseling or if you need help with nutrition. Your doctor can give you advice or tell you where to go for help.  Do not use hot tubs, steam rooms, or saunas.  Do not  douche or use tampons or scented sanitary pads.  Do not cross your legs for long periods of time.  Avoid all herbs and alcohol. Avoid drugs that are not approved by your doctor.  Do not use any tobacco products, including cigarettes, chewing tobacco, and electronic cigarettes. If you need help quitting, ask your doctor. You may get counseling or other support to help you quit.  Avoid cat litter boxes and soil used by cats. These carry germs that can cause birth defects in the baby and can cause a loss of your baby (miscarriage) or stillbirth.  Visit your dentist. At home, brush your teeth with a soft toothbrush. Be gentle when you floss. Contact a doctor if:  You are dizzy.  You have mild cramps or pressure in your lower belly.  You have a nagging  pain in your belly area.  You continue to feel sick to your stomach, you throw up, or you have watery poop (diarrhea).  You have a bad smelling fluid coming from your vagina.  You have pain when you pee (urinate).  You have increased puffiness (swelling) in your face, hands, legs, or ankles. Get help right away if:  You have a fever.  You are leaking fluid from your vagina.  You have spotting or bleeding from your vagina.  You have very bad belly cramping or pain.  You gain or lose weight rapidly.  You throw up blood. It may look like coffee grounds.  You are around people who have [place] measles, fifth disease, or chickenpox.  You have a very bad headache.  You have shortness of breath.  You have any kind of trauma, such as from a fall or a car accident. Summary  The first trimester of pregnancy is from week 1 until the end of week 13 (months 1 through 3).  To take care of yourself and your unborn baby, you will need to eat healthy meals, take medicines only if your doctor tells you to do so, and do activities that are safe for you and your baby.  Keep all follow-up visits as told by your doctor. This is important as  your doctor will have to ensure that your baby is healthy and growing well. This information is not intended to replace advice given to you by your health care provider. Make sure you discuss any questions you have with your health care provider. Document Released: 12/09/2007 Document Revised: 06/30/2016 Document Reviewed: 06/30/2016 Elsevier Interactive Patient Education  2017 Else[employer] Inc.    Safe Medications in Pregnancy   Acne: Benzoyl Peroxide Salicylic Acid  Backache/Headache: Tylenol: 2 regular strength every 4 hours OR              2 Extra strength every 6 hours  Colds/Coughs/Allergies: Benadryl (alcohol free) 25 mg every 6 hours as needed Breath right strips Claritin Cepacol throat lozenges Chloraseptic throat spray Cold-Eeze- up to three times per day Cough drops, alcohol free Flonase (by prescription only) Guaifenesin Mucinex Robitussin DM (plain only, alcohol free) Saline nasal spray/drops Sudafed (pseudoephedrine) & Actifed ** use only after [redacted] weeks gestation and if you do not have high blood pressure Tylenol Vicks Vaporub Zinc lozenges Zyrtec   Constipation: Colace Ducolax suppositories Fleet enema Glycerin suppositories Metamucil Milk of magnesia Miralax Senokot Smooth move tea  Diarrhea: Kaopectate Imodium A-D  *NO pepto Bismol  Hemorrhoids: Anusol Anusol HC Preparation H Tucks  Indigestion: Tums Maalox Mylanta Zantac  Pepcid  Insomnia: Benadryl (alcohol free) 25mg  every 6 hours as needed Tylenol PM Unisom, no Gelcaps  Leg Cramps: Tums MagGel  Nausea/Vomiting:  Bonine Dramamine Emetrol Ginger extract Sea bands Meclizine  Nausea medication to take during pregnancy:  Unisom (doxylamine succinate 25 mg tablets) Take one tablet daily at bedtime. If symptoms are not adequately controlled, the dose can be increased to a maximum recommended dose of two tablets daily (1/2 tablet in the morning, 1/2 tablet mid-afternoon and  one at bedtime). Vitamin B6 100mg  tablets. Take one tablet twice a day (up to 200 mg per day).  Skin Rashes: Aveeno products Benadryl cream or 25mg  every 6 hours as needed Calamine Lotion 1% cortisone cream  Yeast infection: Gyne-lotrimin 7 Monistat 7  Gum/tooth pain: Anbesol  **If taking multiple medications, please check labels to avoid duplicating the same active ingredients **take medication as directed on the label **  Do not exceed 4000 mg of tylenol in 24 hours **Do not take medications that contain aspirin or ibuprofen

## 2018-01-31 NOTE — BH Specialist Note (Signed)
Integrated Behavioral Health Initial Visit  MRN: [id number] Name: Christine Martin  Number of Integrated Behavioral Health Clinician visits:: 1/6 Session Start time: 4:20  Session End time: 4:25 Total time: 5 minutes  Type of Service: Integrated Behavioral Health- Individual/Family Interpretor:No. Interpretor Name and Language: n/a   Warm Hand Off Completed.       SUBJECTIVE: Christine Martin is a 27 y.o. female accompanied by n/a Patient was referred by Judeth HornErin Lawrence, NP for initial OB introduction to behavioral health services. Patient reports the following symptoms/concerns: Pt states no concerns today. Duration of problem: n/a; Severity of problem: n/a  OBJECTIVE: Mood: Appropriate and Affect: Appropriate Risk of harm to self or others: No plan to harm self or others  LIFE CONTEXT: Family and Social: - School/Work: - Self-Care: - Life Changes: Current pregnancy -  GOALS ADDRESSED: Patient will: INTERVENTIONS: Standardized Assessments completed: GAD-7 and PHQ 9  ASSESSMENT: Patient currently experiencing Supervision of low risk pregnancy.   Patient may benefit from new OB introduction to integrated behavioral health services.  PLAN: 1. Follow up with behavioral health clinician on : As needed   2. Referral(s[facility] 3. "From scale of 1-10, how likely are you to follow plan?": -  Rae LipsJamie C McMannes, LCSW

## 2018-01-31 NOTE — Progress Notes (Signed)
Subjective:   Christine Martin is a 27 y.o. G2P0010 at 5321w3d by early ultrasound being seen today for her first obstetrical visit.  Her obstetrical history is significant for group B strep colonizer. Patient does intend to breast feed. Pregnancy history fully reviewed. This was not a planned pregnancy & the [name] is not involved but patient is happy.  Reports that she was a preterm infant weighing 3 lbs at delivery but doesn't know how many weeks she was.  Pregnancy hx includes 10 wk TAB in February of this year. Was tx for chlamydia at that time. Had GBS in urine culture last week.  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 She denies tobacco use, alcohol, or illicit drug use. Does not have any pets - no cats.   Patient reports no complaints.  HISTORY: OB History  Gravida Para Term Preterm AB Living  2 0 0 0 1 0  SAB TAB Ectopic Multiple Live Births  0 1 0 0 0    # Outcome Date GA Lbr Len/2nd Weight Sex Delivery Anes PTL Lv  2 Current           1 TAB 08/2017 3961w0d          Past Medical History:  Diagnosis Date  . Anemia   . Hx of chlamydia infection 08/2017  . Hx of chlamydia infection 08/2017  . Preterm infant    pt states she was born preterm but doesn't know how many weeks. Weighed 3 lbs   Past Surgical History:  Procedure Laterality Date  . THERAPEUTIC ABORTION  08/2017   Family History  Problem Relation Age of Onset  . Lupus Mother   . Rheum arthritis Mother    Social History   Tobacco Use  . Smoking status: Never Smoker  . Smokeless tobacco: Never Used  Substance Use Topics  . Alcohol use: Yes    Frequency: Never    Comment: social  . Drug use: No   No Known Allergies Current Outpatient Medications on File Prior to Visit  Medication Sig Dispense Refill  . acetaminophen (TYLENOL) 500 MG tablet Take 500 mg by mouth every 6 (six) hours as needed.    . Prenatal Vit-Fe Fumarate-FA (PREPLUS) 27-1 MG TABS Take 1 tablet by mouth daily.     No current  facility-administered medications on file prior to visit.    Exam   Vitals:   01/31/18 1526  BP: 115/69  Pulse: 89  Weight: 110 lb 4.8 oz (50 kg)    Bedside ultrasound confirms active IUP with cardiac activity  Uterus:   Enlarged 10 wks size  Pelvic Exam: Perineum: no hemorrhoids, normal perineum   Vulva: normal external genitalia, no lesions   Vagina:  normal mucosa, normal discharge   Cervix: no lesions and normal, pap smear done.    Adnexa: normal adnexa and no mass, fullness, tenderness  System: General: well-developed, well-nourished female in no acute distress   Breast:  normal appearance, no masses or tenderness. Bilateral nipple peircing Negative breast exam   Skin: normal coloration and turgor, no rashes   Neurologic: oriented, normal, negative, normal mood   Extremities: normal strength, tone, and muscle mass, ROM of all joints is normal   HEENT PERRLA, extraocular movement intact and sclera clear, anicteric   Mouth/Teeth mucous membranes moist, pharynx normal without lesions and dental hygiene good   Neck supple and no masses   Cardiovascular: regular rate and rhythm   Respiratory:  no respiratory distress,  normal breath sounds   Abdomen: soft, non-tender; bowel sounds normal; no masses,  no organomegaly     Assessment:   Pregnancy: G2P0010 Patient Active Problem List   Diagnosis Date Noted  . Supervision of low-risk pregnancy 01/31/2018  . GBS bacteriuria 01/31/2018  . Preterm infant      Plan:  1. Encounter for supervision of low-risk pregnancy, antepartum -Patient signed up for mychart - Culture, OB Urine - Cystic fibrosis gene test - Cytology - PAP - Hemoglobinopathy Evaluation - Obstetric Panel, Including HIV - SMN1 COPY NUMBER ANALYSIS (SMA Carrier Screen) - [place] MFM OB COMP + 14 WK; Future - [facility] AMB BABYSCRIPTS OPT IN  2. GBS bacteriuria -Discussed results. Will tx in labor. Patient with NKDA   Initial labs drawn. Continue prenatal  vitamins. Genetic Screening discussed, First trimester screen, Quad screen and NIPS: declined. Ultrasound discussed; fetal anatomic survey: ordered. Problem list reviewed and updated. The nature of [facility] with multiple MDs and other Advanced Practice Providers was explained to patient; also emphasized that residents, students are part of our team. Routine obstetric precautions reviewed. F/u in 4 weeks for routine care   Judeth Horn 4:26 PM 01/31/18

## 2018-02-02 LAB — URINE CULTURE, OB REFLEX

## 2018-02-02 LAB — CULTURE, OB URINE

## 2018-02-02 LAB — CYTOLOGY - PAP
CHLAMYDIA, DNA PROBE: NEGATIVE
DIAGNOSIS: NEGATIVE
Neisseria Gonorrhea: NEGATIVE

## 2018-02-08 LAB — SMN1 COPY NUMBER ANALYSIS (SMA CARRIER SCREENING)

## 2018-02-08 LAB — OBSTETRIC PANEL, INCLUDING HIV
Antibody Screen: NEGATIVE
Basophils Absolute: 0 10*3/uL (ref 0.0–0.2)
Basos: 1 %
EOS (ABSOLUTE): 0.2 10*3/uL (ref 0.0–0.4)
Eos: 3 %
HEP B S AG: NEGATIVE
HIV Screen 4th Generation wRfx: NONREACTIVE
Hematocrit: 40.9 % (ref 34.0–46.6)
Hemoglobin: 13.5 g/dL (ref 11.1–15.9)
IMMATURE GRANS (ABS): 0 10*3/uL (ref 0.0–0.1)
IMMATURE GRANULOCYTES: 0 %
LYMPHS: 23 %
Lymphocytes Absolute: 1.5 10*3/uL (ref 0.7–3.1)
MCH: 32.7 pg (ref 26.6–33.0)
MCHC: 33 g/dL (ref 31.5–35.7)
MCV: 99 fL — ABNORMAL HIGH (ref 79–97)
MONOCYTES: 7 %
Monocytes Absolute: 0.5 10*3/uL (ref 0.1–0.9)
NEUTROS PCT: 66 %
Neutrophils Absolute: 4.6 10*3/uL (ref 1.4–7.0)
Platelets: 417 10*3/uL (ref 150–450)
RBC: 4.13 x10E6/uL (ref 3.77–5.28)
RDW: 13.9 % (ref 12.3–15.4)
RPR Ser Ql: NONREACTIVE
RUBELLA: 3.02 {index} (ref >0.99)
Rh Factor: POSITIVE
WBC: 6.8 10*3/uL (ref 3.4–10.8)

## 2018-02-08 LAB — HEMOGLOBINOPATHY EVALUATION
FERRITIN: 22 ng/mL (ref 15–150)
HGB SOLUBILITY: NEGATIVE
Hgb A2 Quant: 2.3 % (ref 1.8–3.2)
Hgb A: 97.1 % (ref 96.4–98.8)
Hgb C: 0 %
Hgb F Quant: 0.6 % (ref 0.0–2.0)
Hgb S: 0 %
Hgb Variant: 0 %

## 2018-02-08 LAB — CYSTIC FIBROSIS GENE TEST

## 2018-03-02 ENCOUNTER — Ambulatory Visit (INDEPENDENT_AMBULATORY_CARE_PROVIDER_SITE_OTHER): Payer: Medicaid Other | Admitting: Student

## 2018-03-02 VITALS — BP 117/86 | HR 82 | Wt 116.2 lb

## 2018-03-02 DIAGNOSIS — Z3492 Encounter for supervision of normal pregnancy, unspecified, second trimester: Secondary | ICD-10-CM

## 2018-03-02 NOTE — Progress Notes (Signed)
Brown discharge.  

## 2018-03-02 NOTE — Progress Notes (Signed)
   PRENATAL VISIT NOTE  Subjective:  Christine Martin is a 27 y.o. G2P0010 at 373w5d being seen today for ongoing prenatal care.  She is currently monitored for the following issues for this low-risk pregnancy and has Supervision of low-risk pregnancy; GBS bacteriuria; and Preterm infant on their problem list.  Patient reports no complaints.  Contractions: Not present. Vag. Bleeding: None.   . Denies leaking of fluid.   The following portions of the patient's history were reviewed and updated as appropriate: allergies, current medications, past family history, past medical history, past social history, past surgical history and problem list. Problem list updated.  Objective:   Vitals:   03/02/18 1037  BP: 117/86  Pulse: 82  Weight: 116 lb 3.2 oz (52.7 kg)    Fetal Status: Fetal Heart Rate (bpm): 152        FH 1 below umbilicus  General:  Alert, oriented and cooperative. Patient is in no acute distress.  Skin: Skin is warm and dry. No rash noted.   Cardiovascular: Normal heart rate noted  Respiratory: Normal respiratory effort, no problems with respiration noted  Abdomen: Soft, gravid, appropriate for gestational age.  Pain/Pressure: Absent     Pelvic: Cervical exam deferred        Extremities: Normal range of motion.  Edema: None  Mental Status: Normal mood and affect. Normal behavior. Normal judgment and thought content.   Assessment and Plan:  Pregnancy: G2P0010 at 583w5d  1. Encounter for supervision of low-risk pregnancy in second trimester -doing well -reviewed lab results  Preterm labor symptoms and general obstetric precautions including but not limited to vaginal bleeding, contractions, leaking of fluid and fetal movement were reviewed in detail with the patient. Please refer to After Visit Summary for other counseling recommendations.  Return in about 4 weeks (around 03/30/2018) for Routine OB.  Future Appointments  Date Time Provider Department Center  03/24/2018 10:15 AM  [facility] US 4 [facility]  03/30/2018  9:35 AM Raelyn Moraawson, [place], CNM [facility]    Judeth HornErin Cortlynn Hollinsworth, NP

## 2018-03-02 NOTE — Patient Instructions (Signed)
Second Trimester of Pregnancy The second trimester is from week 13 through week 28, month 4 through 6. This is often the time in pregnancy that you feel your best. Often times, morning sickness has lessened or quit. You may have more energy, and you may get hungry more often. Your unborn baby (fetus) is growing rapidly. At the end of the sixth month, he or she is about 9 inches long and weighs about 1 pounds. You will likely feel the baby move (quickening) between 18 and 20 weeks of pregnancy.  Research childbirth classes and hospital preregistration at [email or web address]  Follow these instructions at home:  Avoid all smoking, herbs, and alcohol. Avoid drugs not approved by your doctor.  Do not use any tobacco products, including cigarettes, chewing tobacco, and electronic cigarettes. If you need help quitting, ask your doctor. You may get counseling or other support to help you quit.  Only take medicine as told by your doctor. Some medicines are safe and some are not during pregnancy.  Exercise only as told by your doctor. Stop exercising if you start having cramps.  Eat regular, healthy meals.  Wear a good support bra if your breasts are tender.  Do not use hot tubs, steam rooms, or saunas.  Wear your seat belt when driving.  Avoid raw meat, uncooked cheese, and liter boxes and soil used by cats.  Take your prenatal vitamins.  Take 1500-2000 milligrams of calcium daily starting at the 20th week of pregnancy until you deliver your baby.  Try taking medicine that helps you poop (stool softener) as needed, and if your doctor approves. Eat more fiber by eating fresh fruit, vegetables, and whole grains. Drink enough fluids to keep your pee (urine) clear or pale yellow.  Take warm water baths (sitz baths) to soothe pain or discomfort caused by hemorrhoids. Use hemorrhoid cream if your doctor approves.  If you have puffy, bulging veins (varicose veins), wear support hose. Raise  (elevate) your feet for 15 minutes, 3-4 times a day. Limit salt in your diet.  Avoid heavy lifting, wear low heals, and sit up straight.  Rest with your legs raised if you have leg cramps or low back pain.  Visit your dentist if you have not gone during your pregnancy. Use a soft toothbrush to brush your teeth. Be gentle when you floss.  You can have sex (intercourse) unless your doctor tells you not to.  Go to your doctor visits.  Get help if:  You feel dizzy.  You have mild cramps or pressure in your lower belly (abdomen).  You have a nagging pain in your belly area.  You continue to feel sick to your stomach (nauseous), throw up (vomit), or have watery poop (diarrhea).  You have bad smelling fluid coming from your vagina.  You have pain with peeing (urination). Get help right away if:  You have a fever.  You are leaking fluid from your vagina.  You have spotting or bleeding from your vagina.  You have severe belly cramping or pain.  You lose or gain weight rapidly.  You have trouble catching your breath and have chest pain.  You notice sudden or extreme puffiness (swelling) of your face, hands, ankles, feet, or legs.  You have not felt the baby move in over an hour.  You have severe headaches that do not go away with medicine.  You have vision changes. This information is not intended to replace advice given to you by your health care provider. Make   sure you discuss any questions you have with your health care provider. Document Released: 09/16/2009 Document Revised: 11/28/2015 Document Reviewed: 08/23/2012 Elsevier Interactive Patient Education  2017 Elsevier Inc.    

## 2018-03-18 ENCOUNTER — Encounter (HOSPITAL_COMMUNITY): Payer: Self-pay

## 2018-03-24 ENCOUNTER — Other Ambulatory Visit: Payer: Self-pay | Admitting: Student

## 2018-03-24 ENCOUNTER — Ambulatory Visit (HOSPITAL_COMMUNITY)
Admission: RE | Admit: 2018-03-24 | Discharge: 2018-03-24 | Disposition: A | Discharge status: Home / Self Care | Payer: Medicaid Other | Source: Ambulatory Visit | Attending: Student | Admitting: Student

## 2018-03-24 ENCOUNTER — Other Ambulatory Visit (HOSPITAL_COMMUNITY): Payer: Self-pay | Admitting: *Deleted

## 2018-03-24 DIAGNOSIS — Z363 Encounter for antenatal screening for malformations: Secondary | ICD-10-CM

## 2018-03-24 DIAGNOSIS — Z3A18 18 weeks gestation of pregnancy: Secondary | ICD-10-CM | POA: Diagnosis not present

## 2018-03-24 DIAGNOSIS — Z3492 Encounter for supervision of normal pregnancy, unspecified, second trimester: Secondary | ICD-10-CM | POA: Insufficient documentation

## 2018-03-24 DIAGNOSIS — Z3686 Encounter for antenatal screening for cervical length: Secondary | ICD-10-CM

## 2018-03-24 DIAGNOSIS — Z3490 Encounter for supervision of normal pregnancy, unspecified, unspecified trimester: Secondary | ICD-10-CM | POA: Diagnosis present

## 2018-03-24 DIAGNOSIS — Z362 Encounter for other antenatal screening follow-up: Secondary | ICD-10-CM

## 2018-03-25 ENCOUNTER — Encounter: Payer: Self-pay | Admitting: Student

## 2018-03-25 DIAGNOSIS — O4442 Low lying placenta NOS or without hemorrhage, second trimester: Secondary | ICD-10-CM | POA: Insufficient documentation

## 2018-03-30 ENCOUNTER — Ambulatory Visit (INDEPENDENT_AMBULATORY_CARE_PROVIDER_SITE_OTHER): Payer: Medicaid Other | Admitting: Obstetrics and Gynecology

## 2018-03-30 DIAGNOSIS — Z3492 Encounter for supervision of normal pregnancy, unspecified, second trimester: Secondary | ICD-10-CM | POA: Diagnosis not present

## 2018-03-30 NOTE — Progress Notes (Signed)
   PRENATAL VISIT NOTE  Subjective:  Christine Martin is a 27 y.o. G2P0010 at [redacted]w[redacted]d being seen today for ongoing prenatal care.  She is currently monitored for the following issues for this low-risk pregnancy and has Supervision of low-risk pregnancy; GBS bacteriuria; Preterm infant; and Low lying placenta nos or without hemorrhage, second trimester on their problem list.  Patient reports haven't felt FM yet.  Contractions: Not present. Vag. Bleeding: None.  Movement: Absent. Denies leaking of fluid.   The following portions of the patient's history were reviewed and updated as appropriate: allergies, current medications, past family history, past medical history, past social history, past surgical history and problem list. Problem list updated.  Objective:   Vitals:   03/30/18 1008  BP: 121/67  Pulse: 88  Weight: 119 lb 1.6 oz (54 kg)    Fetal Status: Fetal Heart Rate (bpm): 145   Movement: Absent     General:  Alert, oriented and cooperative. Patient is in no acute distress.  Skin: Skin is warm and dry. No rash noted.   Cardiovascular: Normal heart rate noted  Respiratory: Normal respiratory effort, no problems with respiration noted  Abdomen: Soft, gravid, appropriate for gestational age.  Pain/Pressure: Present     Pelvic: Cervical exam deferred        Extremities: Normal range of motion.  Edema: None  Mental Status: Normal mood and affect. Normal behavior. Normal judgment and thought content.   Assessment and Plan:  Pregnancy: G2P0010 at [redacted]w[redacted]d  1. Encounter for supervision of low-risk pregnancy in second trimester - Reassurance given that FM at this gestation is felt like gas in abdomen, it is usual for 1st time moms feel FM from 18 to 20 wks - Discussed need for F/U to measure distance of placental edge from cervical edge / patient & [name] verbalized an understanding.  Preterm labor symptoms and general obstetric precautions including but not limited to vaginal bleeding,  contractions, leaking of fluid and fetal movement were reviewed in detail with the patient. Please refer to After Visit Summary for other counseling recommendations.  Return in about 4 weeks (around 04/27/2018) for Return OB visit.  Future Appointments  Date Time Provider Department Center  04/21/2018  9:15 AM [facility] [place] 4 [facility]  04/27/2018  9:15 AM Dorathy Kinsman, CNM [facility]    Raelyn Mora, [place]

## 2018-03-30 NOTE — Patient Instructions (Signed)
Second Trimester of Pregnancy The second trimester is from week 13 through week 28, month 4 through 6. This is often the time in pregnancy that you feel your best. Often times, morning sickness has lessened or quit. You may have more energy, and you may get hungry more often. Your unborn baby (fetus) is growing rapidly. At the end of the sixth month, he or she is about 9 inches long and weighs about 1 pounds. You will likely feel the baby move (quickening) between 18 and 20 weeks of pregnancy. Follow these instructions at home:  Avoid all smoking, herbs, and alcohol. Avoid drugs not approved by your doctor.  Do not use any tobacco products, including cigarettes, chewing tobacco, and electronic cigarettes. If you need help quitting, ask your doctor. You may get counseling or other support to help you quit.  Only take medicine as told by your doctor. Some medicines are safe and some are not during pregnancy.  Exercise only as told by your doctor. Stop exercising if you start having cramps.  Eat regular, healthy meals.  Wear a good support bra if your breasts are tender.  Do not use hot tubs, steam rooms, or saunas.  Wear your seat belt when driving.  Avoid raw meat, uncooked cheese, and liter boxes and soil used by cats.  Take your prenatal vitamins.  Take 1500-2000 milligrams of calcium daily starting at the 20th week of pregnancy until you deliver your baby.  Try taking medicine that helps you poop (stool softener) as needed, and if your doctor approves. Eat more fiber by eating fresh fruit, vegetables, and whole grains. Drink enough fluids to keep your pee (urine) clear or pale yellow.  Take warm water baths (sitz baths) to soothe pain or discomfort caused by hemorrhoids. Use hemorrhoid cream if your doctor approves.  If you have puffy, bulging veins (varicose veins), wear support hose. Raise (elevate) your feet for 15 minutes, 3-4 times a day. Limit salt in your diet.  Avoid heavy  lifting, wear low heals, and sit up straight.  Rest with your legs raised if you have leg cramps or low back pain.  Visit your dentist if you have not gone during your pregnancy. Use a soft toothbrush to brush your teeth. Be gentle when you floss.  You can have sex (intercourse) unless your doctor tells you not to.  Go to your doctor visits. Get help if:  You feel dizzy.  You have mild cramps or pressure in your lower belly (abdomen).  You have a nagging pain in your belly area.  You continue to feel sick to your stomach (nauseous), throw up (vomit), or have watery poop (diarrhea).  You have bad smelling fluid coming from your vagina.  You have pain with peeing (urination). Get help right away if:  You have a fever.  You are leaking fluid from your vagina.  You have spotting or bleeding from your vagina.  You have severe belly cramping or pain.  You lose or gain weight rapidly.  You have trouble catching your breath and have chest pain.  You notice sudden or extreme puffiness (swelling) of your face, hands, ankles, feet, or legs.  You have not felt the baby move in over an hour.  You have severe headaches that do not go away with medicine.  You have vision changes. This information is not intended to replace advice given to you by your health care provider. Make sure you discuss any questions you have with your health care   provider. Document Released: 09/16/2009 Document Revised: 11/28/2015 Document Reviewed: 08/23/2012 Elsevier Interactive Patient Education  2017 Elsevier Inc.  

## 2018-03-30 NOTE — Progress Notes (Signed)
Medicaid Home Form Completed-03/30/18

## 2018-04-21 ENCOUNTER — Other Ambulatory Visit (HOSPITAL_COMMUNITY): Payer: Self-pay | Admitting: Obstetrics and Gynecology

## 2018-04-21 ENCOUNTER — Ambulatory Visit (HOSPITAL_COMMUNITY)
Admission: RE | Admit: 2018-04-21 | Discharge: 2018-04-21 | Disposition: A | Discharge status: Home / Self Care | Payer: Medicaid Other | Source: Ambulatory Visit | Attending: Student | Admitting: Student

## 2018-04-21 ENCOUNTER — Other Ambulatory Visit (HOSPITAL_COMMUNITY): Payer: Self-pay | Admitting: *Deleted

## 2018-04-21 ENCOUNTER — Other Ambulatory Visit: Payer: Self-pay | Admitting: Obstetrics and Gynecology

## 2018-04-21 DIAGNOSIS — O4442 Low lying placenta NOS or without hemorrhage, second trimester: Secondary | ICD-10-CM | POA: Diagnosis not present

## 2018-04-21 DIAGNOSIS — Z3A22 22 weeks gestation of pregnancy: Secondary | ICD-10-CM

## 2018-04-21 DIAGNOSIS — Z362 Encounter for other antenatal screening follow-up: Secondary | ICD-10-CM

## 2018-04-21 DIAGNOSIS — O26879 Cervical shortening, unspecified trimester: Secondary | ICD-10-CM

## 2018-04-21 DIAGNOSIS — O26872 Cervical shortening, second trimester: Secondary | ICD-10-CM | POA: Diagnosis not present

## 2018-04-21 MED ORDER — PROGESTERONE MICRONIZED 200 MG PO CAPS: ORAL_CAPSULE | ORAL | 3 refills | Status: DC

## 2018-04-27 ENCOUNTER — Ambulatory Visit (INDEPENDENT_AMBULATORY_CARE_PROVIDER_SITE_OTHER): Payer: Medicaid Other | Admitting: Advanced Practice Midwife

## 2018-04-27 VITALS — BP 120/75 | HR 90 | Wt 121.8 lb

## 2018-04-27 DIAGNOSIS — Z3492 Encounter for supervision of normal pregnancy, unspecified, second trimester: Secondary | ICD-10-CM

## 2018-04-27 DIAGNOSIS — O4442 Low lying placenta NOS or without hemorrhage, second trimester: Secondary | ICD-10-CM

## 2018-04-27 DIAGNOSIS — O26872 Cervical shortening, second trimester: Secondary | ICD-10-CM

## 2018-04-27 DIAGNOSIS — O26879 Cervical shortening, unspecified trimester: Secondary | ICD-10-CM | POA: Insufficient documentation

## 2018-04-27 NOTE — Progress Notes (Signed)
   PRENATAL VISIT NOTE  Subjective:  Christine Martin is a 27 y.o. G2P0010 at [redacted]w[redacted]d being seen today for ongoing prenatal care.  She is currently monitored for the following issues for this high-risk pregnancy and has Supervision of low-risk pregnancy; GBS bacteriuria; Preterm infant; Low lying placenta nos or without hemorrhage, second trimester; and Short cervical length during pregnancy on their problem list.  Patient reports dizziness and HA since starting prometrium two days ago..  Contractions: Not present. Vag. Bleeding: None.  Movement: Present. Denies leaking of fluid.   The following portions of the patient's history were reviewed and updated as appropriate: allergies, current medications, past family history, past medical history, past social history, past surgical history and problem list. Problem list updated.  Objective:   Vitals:   04/27/18 1020  BP: 120/75  Pulse: 90  Weight: 121 lb 12.8 oz (55.2 kg)    Fetal Status: Fetal Heart Rate (bpm): 149 Fundal Height: 24 cm Movement: Present     General:  Alert, oriented and cooperative. Patient is in no acute distress.  Skin: Skin is warm and dry. No rash noted.   Cardiovascular: Normal heart rate noted  Respiratory: Normal respiratory effort, no problems with respiration noted  Abdomen: Soft, gravid, appropriate for gestational age.  Pain/Pressure: Present     Pelvic: Cervical exam deferred        Extremities: Normal range of motion.  Edema: None  Mental Status: Normal mood and affect. Normal behavior. Normal judgment and thought content.   Assessment and Plan:  Pregnancy: G2P0010 at [redacted]w[redacted]d  1. Encounter for supervision of high-risk pregnancy in second trimester   2. Low lying placenta nos or without hemorrhage, second trimester - Posterior per 10/17 [place]  3. Short cervical length during pregnancy in second trimester - [place] 10/31, 11/14 - Continue Prometrium. Let next provider know if you cannot tolerate the medicine.    Preterm labor symptoms and general obstetric precautions including but not limited to vaginal bleeding, contractions, leaking of fluid and fetal movement were reviewed in detail with the patient. Please refer to After Visit Summary for other counseling recommendations.  Return in about 2 weeks (around 05/11/2018) for ROB.  Future Appointments  Date Time Provider Department Center  05/05/2018  2:30 PM [facility] [place] 1 [facility]  05/19/2018  2:30 PM [facility] [place] 1 [facility]    Dorathy Kinsman, CNM

## 2018-04-27 NOTE — Patient Instructions (Signed)

## 2018-05-05 ENCOUNTER — Ambulatory Visit (HOSPITAL_COMMUNITY)
Admission: RE | Admit: 2018-05-05 | Discharge: 2018-05-05 | Disposition: A | Discharge status: Home / Self Care | Payer: Medicaid Other | Source: Ambulatory Visit | Attending: Student | Admitting: Student

## 2018-05-05 ENCOUNTER — Encounter (HOSPITAL_COMMUNITY): Payer: Self-pay

## 2018-05-05 DIAGNOSIS — Z362 Encounter for other antenatal screening follow-up: Secondary | ICD-10-CM | POA: Insufficient documentation

## 2018-05-05 DIAGNOSIS — O4442 Low lying placenta NOS or without hemorrhage, second trimester: Secondary | ICD-10-CM

## 2018-05-05 DIAGNOSIS — Z3492 Encounter for supervision of normal pregnancy, unspecified, second trimester: Secondary | ICD-10-CM

## 2018-05-05 DIAGNOSIS — O26879 Cervical shortening, unspecified trimester: Secondary | ICD-10-CM | POA: Diagnosis present

## 2018-05-05 DIAGNOSIS — Z3A24 24 weeks gestation of pregnancy: Secondary | ICD-10-CM | POA: Diagnosis not present

## 2018-05-05 DIAGNOSIS — R8271 Bacteriuria: Secondary | ICD-10-CM

## 2018-05-05 DIAGNOSIS — O26872 Cervical shortening, second trimester: Secondary | ICD-10-CM | POA: Diagnosis not present

## 2018-05-11 ENCOUNTER — Encounter: Payer: Self-pay | Admitting: Nurse Practitioner

## 2018-05-19 ENCOUNTER — Ambulatory Visit (HOSPITAL_COMMUNITY): Payer: Medicaid Other

## 2018-05-24 ENCOUNTER — Telehealth: Payer: Self-pay | Admitting: Emergency Medicine

## 2018-05-24 NOTE — Telephone Encounter (Signed)
Pt called and stated that she was recently prescribed a medication and she wants to know if she still needs to take it.

## 2018-05-24 NOTE — Telephone Encounter (Signed)
LM for pt that are returning her call please give the office a call or contact us via MyChart.

## 2018-05-25 ENCOUNTER — Telehealth: Payer: Self-pay

## 2018-05-25 NOTE — Telephone Encounter (Signed)
LVM for pt that if she still needed anything or had any concerns to give us a call back.

## 2018-06-20 ENCOUNTER — Other Ambulatory Visit: Payer: Medicaid Other

## 2018-06-20 ENCOUNTER — Other Ambulatory Visit: Payer: Self-pay | Admitting: General Practice

## 2018-06-20 DIAGNOSIS — Z3493 Encounter for supervision of normal pregnancy, unspecified, third trimester: Secondary | ICD-10-CM

## 2018-06-21 LAB — CBC
Hematocrit: 32.4 % — ABNORMAL LOW (ref 34.0–46.6)
Hemoglobin: 11 g/dL — ABNORMAL LOW (ref 11.1–15.9)
MCH: 32.2 pg (ref 26.6–33.0)
MCHC: 34 g/dL (ref 31.5–35.7)
MCV: 95 fL (ref 79–97)
Platelets: 205 10*3/uL (ref 150–450)
RBC: 3.42 x10E6/uL — ABNORMAL LOW (ref 3.77–5.28)
RDW: 11.7 % — ABNORMAL LOW (ref 12.3–15.4)
WBC: 6.2 10*3/uL (ref 3.4–10.8)

## 2018-06-21 LAB — GLUCOSE TOLERANCE, 2 HOURS W/ 1HR
Glucose, 1 hour: 87 mg/dL (ref 65–179)
Glucose, 2 hour: 82 mg/dL (ref 65–152)
Glucose, Fasting: 66 mg/dL (ref 65–91)

## 2018-06-21 LAB — RPR: RPR: NONREACTIVE

## 2018-06-21 LAB — HIV ANTIBODY (ROUTINE TESTING W REFLEX): HIV Screen 4th Generation wRfx: NONREACTIVE

## 2018-06-22 ENCOUNTER — Ambulatory Visit (INDEPENDENT_AMBULATORY_CARE_PROVIDER_SITE_OTHER): Payer: Medicaid Other | Admitting: Nurse Practitioner

## 2018-06-22 VITALS — BP 99/70 | HR 102 | Wt 127.2 lb

## 2018-06-22 DIAGNOSIS — O26873 Cervical shortening, third trimester: Secondary | ICD-10-CM

## 2018-06-22 DIAGNOSIS — Z3493 Encounter for supervision of normal pregnancy, unspecified, third trimester: Secondary | ICD-10-CM | POA: Diagnosis not present

## 2018-06-22 DIAGNOSIS — Z23 Encounter for immunization: Secondary | ICD-10-CM | POA: Diagnosis not present

## 2018-06-22 MED ORDER — PROGESTERONE MICRONIZED 200 MG PO CAPS: ORAL_CAPSULE | ORAL | 3 refills | Status: DC

## 2018-06-22 NOTE — Progress Notes (Signed)
    Subjective:  Christine Martin is a 27 y.o. G2P0010 at 257w5d being seen today for ongoing prenatal care.  She is currently monitored for the following issues for this low-risk pregnancy and has Supervision of low-risk pregnancy; GBS bacteriuria; Preterm infant; Low lying placenta nos or without hemorrhage, second trimester; and Short cervical length during pregnancy on their problem list.  Patient reports some backache - no worse than what she has been having earlier in the pregnancy.  Has not been seen since the end of October.  Had sex a week ago.  Stopped taking the prometrium - ran out and did not know she had refills - did not like taking it anyway..  Contractions: Not present. Vag. Bleeding: None.  Movement: Present. Denies leaking of fluid.   The following portions of the patient's history were reviewed and updated as appropriate: allergies, current medications, past family history, past medical history, past social history, past surgical history and problem list. Problem list updated.  Objective:   Vitals:   06/22/18 1626  BP: 99/70  Pulse: (!) 102  Weight: 127 lb 3.2 oz (57.7 kg)    Fetal Status: Fetal Heart Rate (bpm): 133 Fundal Height: 33 cm Movement: Present     General:  Alert, oriented and cooperative. Patient is in no acute distress.  Skin: Skin is warm and dry. No rash noted.   Cardiovascular: Normal heart rate noted  Respiratory: Normal respiratory effort, no problems with respiration noted  Abdomen: Soft, gravid, appropriate for gestational age. Pain/Pressure: Absent     Pelvic:  Cervical exam deferred        Extremities: Normal range of motion.  Edema: None  Mental Status: Normal mood and affect. Normal behavior. Normal judgment and thought content.   Urinalysis:      Assessment and Plan:  Pregnancy: G2P0010 at 10957w5d  1. Encounter for supervision of low-risk pregnancy in third trimester Advised to go to child birth classes Reviewed labs from earlier this week -  normal Discussed positive GBS  - Tdap vaccine greater than or equal to 7yo IM  2. Short cervical length during pregnancy in third trimester Advised to continue the prometrium - refill sent to her pharmacy.  Advised no sex until 36 weeks.  Preterm labor symptoms and general obstetric precautions including but not limited to vaginal bleeding, contractions, leaking of fluid and fetal movement were reviewed in detail with the patient. Please refer to After Visit Summary for other counseling recommendations.  Return in about 2 weeks (around 07/06/2018).  Nolene BernheimERRI Kambrie Eddleman, RN, MSN, NP-BC Nurse Practitioner, [facility], [facility] 06/22/2018 4:56 PM

## 2018-06-22 NOTE — Patient Instructions (Addendum)
Braxton Hicks Contractions Contractions of the uterus can occur throughout pregnancy, but they are not always a sign that you are in labor. You may have practice contractions called Braxton Hicks contractions. These false labor contractions are sometimes confused with true labor. What are [place] contractions? Braxton Hicks contractions are tightening movements that occur in the muscles of the uterus before labor. Unlike true labor contractions, these contractions do not result in opening (dilation) and thinning of the cervix. Toward the end of pregnancy (32-34 weeks), Braxton Hicks contractions can happen more often and may become stronger. These contractions are sometimes difficult to tell apart from true labor because they can be very uncomfortable. You should not feel embarrassed if you go to the hospital with false labor. Sometimes, the only way to tell if you are in true labor is for your health care provider to look for changes in the cervix. The health care provider will do a physical exam and may monitor your contractions. If you are not in true labor, the exam should show that your cervix is not dilating and your water has not broken. If there are no other health problems associated with your pregnancy, it is completely safe for you to be sent home with false labor. You may continue to have Braxton Hicks contractions until you go into true labor. How to tell the difference between true labor and false labor True labor  Contractions last 30-70 seconds.  Contractions become very regular.  Discomfort is usually felt in the top of the uterus, and it spreads to the lower abdomen and low back.  Contractions do not go away with walking.  Contractions usually become more intense and increase in frequency.  The cervix dilates and gets thinner. False labor  Contractions are usually shorter and not as strong as true labor contractions.  Contractions are usually irregular.  Contractions  are often felt in the front of the lower abdomen and in the groin.  Contractions may go away when you walk around or change positions while lying down.  Contractions get weaker and are shorter-lasting as time goes on.  The cervix usually does not dilate or become thin. Follow these instructions at home:   Take over-the-counter and prescription medicines only as told by your health care provider.  Keep up with your usual exercises and follow other instructions from your health care provider.  Eat and drink lightly if you think you are going into labor.  If Braxton Hicks contractions are making you uncomfortable: ? Change your position from lying down or resting to walking, or change from walking to resting. ? Sit and rest in a tub of warm water. ? Drink enough fluid to keep your urine pale yellow. Dehydration may cause these contractions. ? Do slow and deep breathing several times an hour.  Keep all follow-up prenatal visits as told by your health care provider. This is important. Contact a health care provider if:  You have a fever.  You have continuous pain in your abdomen. Get help right away if:  Your contractions become stronger, more regular, and closer together.  You have fluid leaking or gushing from your vagina.  You pass blood-tinged mucus (bloody show).  You have bleeding from your vagina.  You have low back pain that you never had before.  You feel your baby's head pushing down and causing pelvic pressure.  Your baby is not moving inside you as much as it used to. Summary  Contractions that occur before labor are  called Braxton Hicks contractions, false labor, or practice contractions.  Braxton Hicks contractions are usually shorter, weaker, farther apart, and less regular than true labor contractions. True labor contractions usually become progressively stronger and regular, and they become more frequent.  Manage discomfort from [facility] contractions  by changing position, resting in a warm bath, drinking plenty of water, or practicing deep breathing. This information is not intended to replace advice given to you by your health care provider. Make sure you discuss any questions you have with your health care provider. Document Released: 11/05/2016 Document Revised: 04/06/2017 Document Reviewed: 11/05/2016 Elsevier Interactive Patient Education  2019 [place] B Streptococcus Infection During Pregnancy  Group B Streptococcus (GBS) is a type of bacteria (Streptococcus agalactiae) that is often found in healthy people, commonly in the rectum, vagina, and intestines. In people who are healthy and not pregnant, the bacteria rarely cause serious illness or complications. However, women who test positive for GBS during pregnancy can pass the bacteria to their baby during childbirth, which can cause serious infection in the baby after birth. Women with GBS may also have infections during their pregnancy or immediately after childbirth, such as such as urinary tract infections (UTIs) or infections of the uterus (uterine infections). Having GBS also increases a woman's risk of complications during pregnancy, such as early (preterm) labor or delivery, miscarriage, or stillbirth. Routine testing (screening) for GBS is recommended for all pregnant women. What increases the risk? You may have a higher risk for GBS infection during pregnancy if you had one during a past pregnancy. What are the signs or symptoms? In most cases, GBS infection does not cause symptoms in pregnant women. Signs and symptoms of a possible GBS-related infection may include:  Labor starting before the 37th week of pregnancy.  A UTI or bladder infection, which may cause: ? Fever. ? Pain or burning during urination. ? Frequent urination.  Fever during labor, along with: ? Bad-smelling discharge. ? Uterine tenderness. ? Rapid heartbeat in the mother, baby, or  both. Rare but serious symptoms of a possible GBS-related infection in women include:  Blood infection (septicemia). This may cause fever, chills, or confusion.  Lung infection (pneumonia). This may cause fever, chills, cough, rapid breathing, difficulty breathing, or chest pain.  Bone, joint, skin, or soft tissue infection. How is this diagnosed? You may be screened for GBS between week 35 and week 37 of your pregnancy. If you have symptoms of preterm labor, you may be screened earlier. This condition is diagnosed based on lab test results from:  A swab of fluid from the vagina and rectum.  A urine sample. How is this treated? This condition is treated with antibiotic medicine. When you go into labor, or as soon as your water breaks (your membranes rupture), you will be given antibiotics through an IV tube. Antibiotics will continue until after you give birth. If you are having a cesarean delivery, you do not need antibiotics unless your membranes have already ruptured. Follow these instructions at home:  Take over-the-counter and prescription medicines only as told by your health care provider.  Take your antibiotic medicine as told by your health care provider. Do not stop taking the antibiotic even if you start to feel better.  Keep all pre-birth (prenatal) visits and follow-up visits as told by your health care provider. This is important. Contact a health care provider if:  You have pain or burning when you urinate.  You have to urinate frequently.  You  have a fever or chills.  You develop a bad-smelling vaginal discharge. Get help right away if:  Your membranes rupture.  You go into labor.  You have severe pain in your abdomen.  You have difficulty breathing.  You have chest pain. This information is not intended to replace advice given to you by your health care provider. Make sure you discuss any questions you have with your health care provider. Document  Released: 09/29/2007 Document Revised: 01/17/2016 Document Reviewed: 01/16/2016 Elsevier Interactive Patient Education  2019 [place] Education Options: [facility] Department Classes:  Childbirth education classes can help you get ready for a positive parenting experience. You can also meet other expectant parents and get free stuff for your baby. Each class runs for five weeks on the same night and costs $45 for the mother-to-be and her support person. Medicaid covers the cost if you are eligible. Call [phone] to register. [facility] Childbirth Education:  [phone] or [phone] or [email or web address]  Baby & Me Class: Discuss newborn & infant parenting and family adjustment issues with other new mothers in a relaxed environment. Each week brings a new speaker or baby-centered activity. We encourage new mothers to join [place] every Thursday at 11:00am. Babies birth until crawling. No registration or fee. Daddy [employer]: This course offers Dads-to-be the tools and knowledge needed to feel confident on their journey to becoming new fathers. Experienced dads, who have been trained as coaches, teach dads-to-be how to hold, comfort, diaper, swaddle and play with their infant while being able to support the new mom as well. A class for men taught by men. $25/dad Big Brother/Big Sister: Let your children share in the joy of a new brother or sister in this special class designed just for them. Class includes discussion about how families care for babies: swaddling, holding, diapering, safety as well as how they can be helpful in their new role. This class is designed for children ages 70 to 24, but any age is welcome. Please register each child individually. $5/child  Mom Talk: This mom-led group offers support and connection to mothers as they journey through the adjustments and struggles of that sometimes overwhelming first year after the birth of a child.  Tuesdays at 10:00am and Thursdays at 6:00pm. Babies welcome. No registration or fee. Breastfeeding Support Group: This group is a mother-to-mother support circle where moms have the opportunity to share their breastfeeding experiences. A Lactation Consultant is present for questions and concerns. Meets each Tuesday at 11:00am. No fee or registration. Breastfeeding Your Baby: Learn what to expect in the first days of breastfeeding your newborn.  This class will help you feel more confident with the skills needed to begin your breastfeeding experience. Many new mothers are concerned about breastfeeding after leaving the hospital. This class will also address the most common fears and challenges about breastfeeding during the first few weeks, months and beyond. (call for fee) Comfort Techniques and Tour: This 2 hour interactive class will provide you the opportunity to learn & practice hands-on techniques that can help relieve some of the discomfort of labor and encourage your baby to rotate toward the best position for birth. You and your partner will be able to try a variety of labor positions with birth balls and rebozos as well as practice breathing, relaxation, and visualization techniques. A tour of the [facility] is included with this class. $20 per registrant and support person Childbirth Class- Weekend Option: This class  is a Weekend version of our Birth & Baby series. It is designed for parents who have a difficult time fitting several weeks of classes into their schedule. It covers the care of your newborn and the basics of labor and childbirth. It also includes a [place] of [facility] and lunch. The class is held two consecutive days: beginning on Friday evening from 6:30 - 8:30 p.m. and the next day, Saturday from 9 a.m. - 4 p.m. (call for fee) Doren Custard Class: Interested in a waterbirth?  This informational class will help you discover whether  waterbirth is the right fit for you. Education about waterbirth itself, supplies you would need and how to assemble your support team is what you can expect from this class. Some obstetrical practices require this class in order to pursue a waterbirth. (Not all obstetrical practices offer waterbirth-check with your healthcare provider.) Register only the expectant mom, but you are encouraged to bring your partner to class! Required if planning waterbirth, no fee. Infant/Child CPR: Parents, grandparents, babysitters, and friends learn Cardio-Pulmonary Resuscitation skills for infants and children. You will also learn how to treat both conscious and unconscious choking in infants and children. This Family & Friends program does not offer certification. Register each participant individually to ensure that enough mannequins are available. (Call for fee) Grandparent Love: Expecting a grandbaby? This class is for you! Learn about the latest infant care and safety recommendations and ways to support your own child as he or she transitions into the parenting role. Taught by Registered Nurses who are childbirth instructors, but most importantly...they are grandmothers too! $10/person. Childbirth Class- Natural Childbirth: This series of 5 weekly classes is for expectant parents who want to learn and practice natural methods of coping with the process of labor and childbirth. Relaxation, breathing, massage, visualization, role of the partner, and helpful positioning are highlighted. Participants learn how to be confident in their body's ability to give birth. This class will empower and help parents make informed decisions about their own care. Includes discussion that will help new parents transition into the immediate postpartum period. [facility] is included. We suggest taking this class between 25-32 weeks, but it's only a recommendation. $75 per registrant and one support person or  $30 Medicaid. Childbirth Class- 3 week Series: This option of 3 weekly classes helps you and your labor partner prepare for childbirth. Newborn care, labor & birth, cesarean birth, pain management, and comfort techniques are discussed and a [place] [facility] is included. The class meets at the same time, on the same day of the week for 3 consecutive weeks beginning with the starting date you choose. $60 for registrant and one support person.  Marvelous Multiples: Expecting twins, triplets, or more? This class covers the differences in labor, birth, parenting, and breastfeeding issues that face multiples' parents. NICU tour is included. Led by a Certified Childbirth Educator who is the mother of twins. No fee. Caring for Baby: This class is for expectant and adoptive parents who want to learn and practice the most up-to-date newborn care for their babies. Focus is on birth through the first six weeks of life. Topics include feeding, bathing, diapering, crying, umbilical cord care, circumcision care and safe sleep. Parents learn to recognize symptoms of illness and when to call the pediatrician. Register only the mom-to-be and your partner or support person can plan to come with you! $10 per registrant and support person Childbirth  Class- online option: This online class offers you the freedom to complete a Birth and Baby series in the comfort of your own home. The flexibility of this option allows you to review sections at your own pace, at times convenient to you and your support people. It includes additional video information, animations, quizzes, and extended activities. Get organized with helpful eClass tools, checklists, and trackers. Once you register online for the class, you will receive an email within a few days to accept the invitation and begin the class when the time is right for you. The content will be available to you for 60 days. $60 for 60 days of online access for  you and your support people.  Local Doulas: Natural Baby Doulas [email or web address] Tel: [phone] [email or web address] [employer] [phone] [email or web address] The Labor [facility]im  (also do waterbirth tub rental) [phone] [email or web address] Doula [phone] [email or web address] [place] [email or web address] [employer] (PADA) [email or web address] [place] Birth and Baby  [email or web address] Considering Waterbirth? Guide for patients at [facility] [employer]  Why consider waterbirth?  . Gentle birth for babies . Less pain medicine used in labor . May allow for passive descent/less pushing . May reduce perineal tears  . More mobility and instinctive maternal position changes . Increased maternal relaxation . Reduced blood pressure in labor  Is waterbirth safe? What are the risks of infection, drowning or other complications?  . Infection: o Very low risk (3.7 % for tub vs 4.8% for bed) o 7 in 8000 waterbirths with documented infection o Poorly cleaned equipment most common cause o Slightly lower group B strep transmission rate  . Drowning o Maternal:  - Very low risk   - Related to seizures or fainting o Newborn:  - Very low risk. No evidence of increased risk of respiratory problems in multiple large studies - Physiological protection from breathing under water - Avoid underwater birth if there are any fetal complications - Once baby's head is out of the water, keep it out.  . Birth complication o Some reports of cord trauma, but risk decreased by bringing baby to surface gradually o No evidence of increased risk of shoulder dystocia. Mothers can usually change positions faster in water than in a bed,  possibly aiding the maneuvers to free the shoulder.   You must attend a Doren Custard class at [facility]  3rd Wednesday of every month from 7-9pm  [employer] by calling [phone] or online at [email or web address]  Bring [place] the certificate from the class to your prenatal appointment  Meet with a midwife at 36 weeks to see if you can still plan a waterbirth and to sign the consent.   Purchase or rent the following supplies:   Water Birth Pool (Birth Pool in a Box or [place] for instance)  (Tubs start ~$125)  Single-use disposable tub liner designed for your brand of tub  New garden hose labeled "lead-free", "suitable for drinking water",  Electric drain pump to remove water (We recommend 792 gallon per hour or greater pump.)   Separate garden hose to remove the dirty water  Fish net  Bathing suit top (optional)  Long-handled mirror (optional)  Places to purchase or rent supplies  GotWebTools.is for tub purchases and supplies  Waterbirthsolutions.com for tub purchases and supplies  The Labor Ladies ([email or web address]) $275 for tub rental/set-up & take down/kit   [employer] (http://www.[place].com/.htm) Information regarding doulas (labor support) who provide pool rentals  Our practice has  a Birth Pool in a Box tub at the hospital that you may borrow on a first-come-first-served basis. It is your responsibility to to set up, clean and break down the tub. We cannot guarantee the availability of this tub in advance. You are responsible for bringing all accessories listed above. If you do not have all necessary supplies you cannot have a waterbirth.    Things that would prevent you from having a waterbirth:  Premature, <37wks  Previous cesarean birth  Presence of thick meconium-stained fluid  Multiple gestation (Twins, triplets, etc.)  Uncontrolled diabetes or gestational diabetes requiring medication  Hypertension  requiring medication or diagnosis of pre-eclampsia  Heavy vaginal bleeding  Non-reassuring fetal heart rate  Active infection (MRSA, etc.). Group B Strep is NOT a contraindication for  waterbirth.  If your labor has to be induced and induction method requires continuous  monitoring of the baby's heart rate  Other risks/issues identified by your obstetrical provider  Please remember that birth is unpredictable. Under certain unforeseeable circumstances your provider may advise against giving birth in the tub. These decisions will be made on a case-by-case basis and with the safety of you and your baby as our highest priority.
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
## 2018-07-11 ENCOUNTER — Ambulatory Visit (INDEPENDENT_AMBULATORY_CARE_PROVIDER_SITE_OTHER): Payer: Medicaid Other | Admitting: Advanced Practice Midwife

## 2018-07-11 ENCOUNTER — Encounter: Payer: Self-pay | Admitting: Advanced Practice Midwife

## 2018-07-11 DIAGNOSIS — Z3493 Encounter for supervision of normal pregnancy, unspecified, third trimester: Secondary | ICD-10-CM

## 2018-07-11 DIAGNOSIS — O4443 Low lying placenta NOS or without hemorrhage, third trimester: Secondary | ICD-10-CM

## 2018-07-11 DIAGNOSIS — O26873 Cervical shortening, third trimester: Secondary | ICD-10-CM

## 2018-07-11 DIAGNOSIS — O4442 Low lying placenta NOS or without hemorrhage, second trimester: Secondary | ICD-10-CM

## 2018-07-11 NOTE — Patient Instructions (Addendum)
Vaginal delivery means that you give birth by pushing your baby out of your birth canal (vagina). A team of health care providers will help you before, during, and after vaginal delivery. Birth experiences are unique for every woman and every pregnancy, and birth experiences vary depending on where you choose to give birth. What happens when I arrive at the birth center or hospital? Once you are in labor and have been admitted into the hospital or birth center, your health care provider may:  Review your pregnancy history and any concerns that you have.  Insert an IV into one of your veins. This may be used to give you fluids and medicines.  Check your blood pressure, pulse, temperature, and heart rate (vital signs).  Check whether your bag of water (amniotic sac) has broken (ruptured).  Talk with you about your birth plan and discuss pain control options. Monitoring Your health care provider may monitor your contractions (uterine monitoring) and your baby's heart rate (fetal monitoring). You may need to be monitored:  Often, but not continuously (intermittently).  All the time or for long periods at a time (continuously). Continuous monitoring may be needed if: ? You are taking certain medicines, such as medicine to relieve pain or make your contractions stronger. ? You have pregnancy or labor complications. Monitoring may be done by:  Placing a special stethoscope or a handheld monitoring device on your abdomen to check your baby's heartbeat and to check for contractions.  Placing monitors on your abdomen (external monitors) to record your baby's heartbeat and the frequency and length of contractions.  Placing monitors inside your uterus through your vagina (internal monitors) to record your baby's heartbeat and the frequency, length, and strength of your contractions. Depending on the type of monitor, it may remain in your uterus or on your baby's head until birth.  Telemetry. This is  a type of continuous monitoring that can be done with external or internal monitors. Instead of having to stay in bed, you are able to move around during telemetry. Physical exam Your health care provider may perform frequent physical exams. This may include:  Checking how and where your baby is positioned in your uterus.  Checking your cervix to determine: ? Whether it is thinning out (effacing). ? Whether it is opening up (dilating). What happens during labor and delivery?  Normal labor and delivery is divided into the following three stages: Stage 1  This is the longest stage of labor.  This stage can last for hours or days.  Throughout this stage, you will feel contractions. Contractions generally feel mild, infrequent, and irregular at first. They get stronger, more frequent (about every 2-3 minutes), and more regular as you move through this stage.  This stage ends when your cervix is completely dilated to 4 inches (10 cm) and completely effaced. Stage 2  This stage starts once your cervix is completely effaced and dilated and lasts until the delivery of your baby.  This stage may last from 20 minutes to 2 hours.  This is the stage where you will feel an urge to push your baby out of your vagina.  You may feel stretching and burning pain, especially when the widest part of your baby's head passes through the vaginal opening (crowning).  Once your baby is delivered, the umbilical cord will be clamped and cut. This usually occurs after waiting a period of 1-2 minutes after delivery.  Your baby will be placed on your bare chest (skin-to-skin contact) in  an upright position and covered with a warm blanket. Watch your baby for feeding cues, like rooting or sucking, and help the baby to your breast for his or her first feeding. Stage 3  This stage starts immediately after the birth of your baby and ends after you deliver the placenta.  This stage may take anywhere from 5 to 30  minutes.  After your baby has been delivered, you will feel contractions as your body expels the placenta and your uterus contracts to control bleeding. What can I expect after labor and delivery?  After labor is over, you and your baby will be monitored closely until you are ready to go home to ensure that you are both healthy. Your health care team will teach you how to care for yourself and your baby.  You and your baby will stay in the same room (rooming in) during your hospital stay. This will encourage early bonding and successful breastfeeding.  You may continue to receive fluids and medicines through an IV.  Your uterus will be checked and massaged regularly (fundal massage).  You will have some soreness and pain in your abdomen, vagina, and the area of skin between your vaginal opening and your anus (perineum).  If an incision was made near your vagina (episiotomy) or if you had some vaginal tearing during delivery, cold compresses may be placed on your episiotomy or your tear. This helps to reduce pain and swelling.  You may be given a squirt bottle to use instead of wiping when you go to the bathroom. To use the squirt bottle, follow these steps: ? Before you urinate, fill the squirt bottle with warm water. Do not use hot water. ? After you urinate, while you are sitting on the toilet, use the squirt bottle to rinse the area around your urethra and vaginal opening. This rinses away any urine and blood. ? Fill the squirt bottle with clean water every time you use the bathroom.  It is normal to have vaginal bleeding after delivery. Wear a sanitary pad for vaginal bleeding and discharge. Summary  Vaginal delivery means that you will give birth by pushing your baby out of your birth canal (vagina).  Your health care provider may monitor your contractions (uterine monitoring) and your baby's heart rate (fetal monitoring).  Your health care provider may perform a physical  exam.  Normal labor and delivery is divided into three stages.  After labor is over, you and your baby will be monitored closely until you are ready to go home. This information is not intended to replace advice given to you by your health care provider. Make sure you discuss any questions you have with your health care provider. Document Released: 03/31/2008 Document Revised: 07/27/2017 Document Reviewed: 07/27/2017 Elsevier Interactive Patient Education  2019 [place] to have your son circumcised:    [facility] [phone] while you are in hospital  [facility] [phone] $244 by 4 wks  [facility] [phone] $175 by 2 wks  [facility] [phone] $250 by 7 days [facility] [phone] $269 by 4 wks  These prices sometimes change but are roughly what you can expect to pay. Please call and confirm pricing.   Circumcision is considered an elective/non-medically necessary procedure. There are many reasons parents decide to have their sons circumsized. During the first year of life circumcised males have a reduced risk of urinary tract infections but after this year the rates between circumcised males and uncircumcised males are the same.  It is safe to have your son circumcised outside of the hospital and the places above perform them regularly.   Deciding about Circumcision in Baby Boys  (Up-to-date The Basics)  What is circumcision?  Circumcision is a surgery that removes the skin that covers the tip of the penis, called the "foreskin" Circumcision is usually done when a boy is between 20 and 13 days old. In the [place], circumcision is common. In some other countries, fewer boys are circumcised. Circumcision is a common tradition in some religions.  Should I have my baby boy circumcised?  There is  no easy answer. Circumcision has some benefits. But it also has risks. After talking with your doctor, you will have to decide for yourself what is right for your family.  What are the benefits of circumcision?  Circumcised boys seem to have slightly lower rates of: ?Urinary tract infections ?Swelling of the opening at the tip of the penis Circumcised men seem to have slightly lower rates of: ?Urinary tract infections ?Swelling of the opening at the tip of the penis ?Penis cancer ?HIV and other infections that you catch during sex ?Cervical cancer in the women they have sex with Even so, in the [place], the risks of these problems are small - even in boys and men who have not been circumcised. Plus, boys and men who are not circumcised can reduce these extra risks by: ?Cleaning their penis well ?Using condoms during sex  What are the risks of circumcision?  Risks include: ?Bleeding or infection from the surgery ?Damage to or amputation of the penis ?A chance that the doctor will cut off too much or not enough of the foreskin ?A chance that sex won't feel as good later in life Only about 1 out of every 200 circumcisions leads to problems. There is also a chance that your health insurance won't pay for circumcision.  How is circumcision done in baby boys?  First, the baby gets medicine for pain relief. This might be a cream on the skin or a shot into the base of the penis. Next, the doctor cleans the baby's penis well. Then he or she uses special tools to cut off the foreskin. Finally, the doctor wraps a bandage (called gauze) around the baby's penis. If you have your baby circumcised, his doctor or nurse will give you instructions on how to care for him after the surgery. It is important that you follow those instructions carefully.    Childbirth Education Options: [facility] Department Classes:  Childbirth education classes can help you get ready for a positive  parenting experience. You can also meet other expectant parents and get free stuff for your baby. Each class runs for five weeks on the same night and costs $45 for the mother-to-be and her support person. Medicaid covers the cost if you are eligible. Call [phone] to register. [facility] Childbirth Education:  [phone] or [phone] or [email or web address]  Baby & Me Class: Discuss newborn & infant parenting and family adjustment issues with other new mothers in a relaxed environment. Each week brings a new speaker or baby-centered activity. We encourage new mothers to join [place] every Thursday at 11:00am. Babies birth until crawling. No registration or fee. Daddy [employer]: This course offers Dads-to-be the tools and knowledge needed to feel confident on their journey to becoming new fathers. Experienced dads, who have been trained as coaches, teach dads-to-be how to hold, comfort, diaper, swaddle and play with their infant while being  able to support the new mom as well. A class for men taught by men. $25/dad Big Brother/Big Sister: Let your children share in the joy of a new brother or sister in this special class designed just for them. Class includes discussion about how families care for babies: swaddling, holding, diapering, safety as well as how they can be helpful in their new role. This class is designed for children ages 25 to 6, but any age is welcome. Please register each child individually. $5/child  Mom Talk: This mom-led group offers support and connection to mothers as they journey through the adjustments and struggles of that sometimes overwhelming first year after the birth of a child. Tuesdays at 10:00am and Thursdays at 6:00pm. Babies welcome. No registration or fee. Breastfeeding Support Group: This group is a mother-to-mother support circle where moms have the opportunity to share their breastfeeding experiences. A Lactation Consultant is present for questions and  concerns. Meets each Tuesday at 11:00am. No fee or registration. Breastfeeding Your Baby: Learn what to expect in the first days of breastfeeding your newborn.  This class will help you feel more confident with the skills needed to begin your breastfeeding experience. Many new mothers are concerned about breastfeeding after leaving the hospital. This class will also address the most common fears and challenges about breastfeeding during the first few weeks, months and beyond. (call for fee) Comfort Techniques and Tour: This 2 hour interactive class will provide you the opportunity to learn & practice hands-on techniques that can help relieve some of the discomfort of labor and encourage your baby to rotate toward the best position for birth. You and your partner will be able to try a variety of labor positions with birth balls and rebozos as well as practice breathing, relaxation, and visualization techniques. A tour of the [facility] is included with this class. $20 per registrant and support person Childbirth Class- Weekend Option: This class is a Weekend version of our Birth & Baby series. It is designed for parents who have a difficult time fitting several weeks of classes into their schedule. It covers the care of your newborn and the basics of labor and childbirth. It also includes a [place] [facility] and lunch. The class is held two consecutive days: beginning on Friday evening from 6:30 - 8:30 p.m. and the next day, Saturday from 9 a.m. - 4 p.m. (call for fee) Doren Custard Class: Interested in a waterbirth?  This informational class will help you discover whether waterbirth is the right fit for you. Education about waterbirth itself, supplies you would need and how to assemble your support team is what you can expect from this class. Some obstetrical practices require this class in order to pursue a waterbirth. (Not all obstetrical practices offer  waterbirth-check with your healthcare provider.) Register only the expectant mom, but you are encouraged to bring your partner to class! Required if planning waterbirth, no fee. Infant/Child CPR: Parents, grandparents, babysitters, and friends learn Cardio-Pulmonary Resuscitation skills for infants and children. You will also learn how to treat both conscious and unconscious choking in infants and children. This Family & Friends program does not offer certification. Register each participant individually to ensure that enough mannequins are available. (Call for fee) Grandparent Love: Expecting a grandbaby? This class is for you! Learn about the latest infant care and safety recommendations and ways to support your own child as he or she transitions into the parenting role. Taught by Registered  Nurses who are childbirth instructors, but most importantly...they are grandmothers too! $10/person. Childbirth Class- Natural Childbirth: This series of 5 weekly classes is for expectant parents who want to learn and practice natural methods of coping with the process of labor and childbirth. Relaxation, breathing, massage, visualization, role of the partner, and helpful positioning are highlighted. Participants learn how to be confident in their body's ability to give birth. This class will empower and help parents make informed decisions about their own care. Includes discussion that will help new parents transition into the immediate postpartum period. [facility] is included. We suggest taking this class between 25-32 weeks, but it's only a recommendation. $75 per registrant and one support person or $30 Medicaid. Childbirth Class- 3 week Series: This option of 3 weekly classes helps you and your labor partner prepare for childbirth. Newborn care, labor & birth, cesarean birth, pain management, and comfort techniques are discussed and a [place] [facility]  is included. The class meets at the same time, on the same day of the week for 3 consecutive weeks beginning with the starting date you choose. $60 for registrant and one support person.  Marvelous Multiples: Expecting twins, triplets, or more? This class covers the differences in labor, birth, parenting, and breastfeeding issues that face multiples' parents. NICU tour is included. Led by a Certified Childbirth Educator who is the mother of twins. No fee. Caring for Baby: This class is for expectant and adoptive parents who want to learn and practice the most up-to-date newborn care for their babies. Focus is on birth through the first six weeks of life. Topics include feeding, bathing, diapering, crying, umbilical cord care, circumcision care and safe sleep. Parents learn to recognize symptoms of illness and when to call the pediatrician. Register only the mom-to-be and your partner or support person can plan to come with you! $10 per registrant and support person Childbirth Class- online option: This online class offers you the freedom to complete a Birth and Baby series in the comfort of your own home. The flexibility of this option allows you to review sections at your own pace, at times convenient to you and your support people. It includes additional video information, animations, quizzes, and extended activities. Get organized with helpful eClass tools, checklists, and trackers. Once you register online for the class, you will receive an email within a few days to accept the invitation and begin the class when the time is right for you. The content will be available to you for 60 days. $60 for 60 days of online access for you and your support people.  Local Doulas: Natural Baby Doulas [email or web address] Tel: [phone] [email or web address] [phone] [email or web address] The Labor Hassell Halim  (also do waterbirth tub  rental) [phone] [email or web address] [email or web address] Birth Doula [phone] [email or web address] [facility] Rhythms  [phone] [email or web address] [employer] (PADA) [email or web address] [place] Birth and Baby  [email or web address] Considering Waterbirth? Guide for patients at [facility] [employer]  Why consider waterbirth?  . Gentle birth for babies . Less pain medicine used in labor . May allow for passive descent/less pushing . May reduce perineal tears  . More mobility and instinctive maternal position changes . Increased maternal relaxation . Reduced blood pressure in labor  Is waterbirth safe? What are the risks of infection, drowning or other complications?  . Infection: o Very low risk (3.7 % for tub vs 4.8% for bed) o 7 in 8000 waterbirths with  documented infection o Poorly cleaned equipment most common cause o Slightly lower group B strep transmission rate  . Drowning o Maternal:  - Very low risk   - Related to seizures or fainting o Newborn:  - Very low risk. No evidence of increased risk of respiratory problems in multiple large studies - Physiological protection from breathing under water - Avoid underwater birth if there are any fetal complications - Once baby's head is out of the water, keep it out.  . Birth complication o Some reports of cord trauma, but risk decreased by bringing baby to surface gradually o No evidence of increased risk of shoulder dystocia. Mothers can usually change positions faster in water than in a bed, possibly aiding the maneuvers to free the shoulder.   You must attend a Doren Custard class at [facility]  3rd Wednesday of every month from 7-9pm  [employer] by calling [phone] or online at [email or web address]  Bring [place] the certificate from the class to  your prenatal appointment  Meet with a midwife at 36 weeks to see if you can still plan a waterbirth and to sign the consent.   Purchase or rent the following supplies:   Water Birth Pool (Birth Pool in a Box or [place] for instance)  (Tubs start ~$125)  Single-use disposable tub liner designed for your brand of tub  New garden hose labeled "lead-free", "suitable for drinking water",  Electric drain pump to remove water (We recommend 792 gallon per hour or greater pump.)   Separate garden hose to remove the dirty water  Fish net  Bathing suit top (optional)  Long-handled mirror (optional)  Places to purchase or rent supplies  GotWebTools.is for tub purchases and supplies  Waterbirthsolutions.com for tub purchases and supplies  The Labor Ladies ([email or web address]) $275 for tub rental/set-up & take down/kit   [employer] (http://www.[place].com/.htm) Information regarding doulas (labor support) who provide pool rentals  Our practice has a Birth Pool in a Box tub at the hospital that you may borrow on a first-come-first-served basis. It is your responsibility to to set up, clean and break down the tub. We cannot guarantee the availability of this tub in advance. You are responsible for bringing all accessories listed above. If you do not have all necessary supplies you cannot have a waterbirth.    Things that would prevent you from having a waterbirth:  Premature, <37wks  Previous cesarean birth  Presence of thick meconium-stained fluid  Multiple gestation (Twins, triplets, etc.)  Uncontrolled diabetes or gestational diabetes requiring medication  Hypertension requiring medication or diagnosis of pre-eclampsia  Heavy vaginal bleeding  Non-reassuring fetal heart rate  Active infection (MRSA, etc.). Group B Strep is NOT a contraindication for  waterbirth.  If your labor has to be induced and induction method requires continuous   monitoring of the baby's heart rate  Other risks/issues identified by your obstetrical provider  Please remember that birth is unpredictable. Under certain unforeseeable circumstances your provider may advise against giving birth in the tub. These decisions will be made on a case-by-case basis and with the safety of you and your baby as our highest priority.

## 2018-07-11 NOTE — Progress Notes (Signed)
   PRENATAL VISIT NOTE  Subjective:  Christine Martin is a 28 y.o. G2P0010 at [redacted]w[redacted]d being seen today for ongoing prenatal care.  She is currently monitored for the following issues for this low-risk pregnancy and has Supervision of low-risk pregnancy; GBS bacteriuria; Preterm infant; Low lying placenta nos or without hemorrhage, second trimester; and Short cervical length during pregnancy on their problem list.  Patient reports no complaints.  Contractions: Not present. Vag. Bleeding: None.  Movement: Present. Denies leaking of fluid.   The following portions of the patient's history were reviewed and updated as appropriate: allergies, current medications, past family history, past medical history, past social history, past surgical history and problem list. Problem list updated.  Objective:   Vitals:   07/11/18 1407 07/11/18 1429  BP: 132/72 125/70  Pulse: 92 98  Weight: 127 lb 14.4 oz (58 kg)    Patient talking with family while BP being taken.   Fetal Status: Fetal Heart Rate (bpm): 132 Fundal Height: 35 cm Movement: Present     General:  Alert, oriented and cooperative. Patient is in no acute distress.  Skin: Skin is warm and dry. No rash noted.   Cardiovascular: Normal heart rate noted  Respiratory: Normal respiratory effort, no problems with respiration noted  Abdomen: Soft, gravid, appropriate for gestational age.  Pain/Pressure: Present     Pelvic: Cervical exam deferred        Extremities: Normal range of motion.  Edema: None  Mental Status: Normal mood and affect. Normal behavior. Normal judgment and thought content.   Assessment and Plan:  Pregnancy: G2P0010 at [redacted]w[redacted]d  Encounter for supervision of low-risk pregnancy in third trimester - Routine care - GBS at next visit   Preterm labor symptoms and general obstetric precautions including but not limited to vaginal bleeding, contractions, leaking of fluid and fetal movement were reviewed in detail with the patient. Please  refer to After Visit Summary for other counseling recommendations.  Return in about 2 weeks (around 07/25/2018).  Future Appointments  Date Time Provider Department Center  07/25/2018  3:35 PM Judeth Horn, NP [facility]  08/01/2018  3:35 PM Armando Reichert, CNM [facility]  08/08/2018  3:35 PM Armando Reichert, CNM [facility]    Thressa Sheller DNP, CNM  07/11/18  2:40 PM

## 2018-07-25 ENCOUNTER — Ambulatory Visit (INDEPENDENT_AMBULATORY_CARE_PROVIDER_SITE_OTHER): Payer: Medicaid Other | Admitting: Student

## 2018-07-25 ENCOUNTER — Encounter: Payer: Self-pay | Admitting: Student

## 2018-07-25 ENCOUNTER — Other Ambulatory Visit (HOSPITAL_COMMUNITY)
Admission: RE | Admit: 2018-07-25 | Discharge: 2018-07-25 | Disposition: A | Discharge status: Home / Self Care | Payer: Medicaid Other | Source: Ambulatory Visit | Attending: Student | Admitting: Student

## 2018-07-25 VITALS — BP 115/69 | HR 103 | Wt 132.0 lb

## 2018-07-25 DIAGNOSIS — Z3493 Encounter for supervision of normal pregnancy, unspecified, third trimester: Secondary | ICD-10-CM | POA: Insufficient documentation

## 2018-07-25 NOTE — Progress Notes (Signed)
   PRENATAL VISIT NOTE  Subjective:  Christine Martin is a 28 y.o. G2P0010 at [redacted]w[redacted]d being seen today for ongoing prenatal care.  She is currently monitored for the following issues for this low-risk pregnancy and has Supervision of low-risk pregnancy; GBS bacteriuria; Preterm infant; Low lying placenta nos or without hemorrhage, second trimester; and Short cervical length during pregnancy on their problem list.  Patient reports no complaints.  Contractions: Not present. Vag. Bleeding: None.  Movement: Present. Denies leaking of fluid.   The following portions of the patient's history were reviewed and updated as appropriate: allergies, current medications, past family history, past medical history, past social history, past surgical history and problem list. Problem list updated.  Objective:   Vitals:   07/25/18 1605  BP: 115/69  Pulse: (!) 103  Weight: 132 lb (59.9 kg)    Fetal Status: Fetal Heart Rate (bpm): 142 Fundal Height: 36 cm Movement: Present  Presentation: Vertex  General:  Alert, oriented and cooperative. Patient is in no acute distress.  Skin: Skin is warm and dry. No rash noted.   Cardiovascular: Normal heart rate noted  Respiratory: Normal respiratory effort, no problems with respiration noted  Abdomen: Soft, gravid, appropriate for gestational age.  Pain/Pressure: Present     Pelvic: Cervical exam deferred        Extremities: Normal range of motion.  Edema: None  Mental Status: Normal mood and affect. Normal behavior. Normal judgment and thought content.   Assessment and Plan:  Pregnancy: G2P0010 at [redacted]w[redacted]d  1. Encounter for supervision of low-risk pregnancy in third trimester -GBS pos in urine, NKDA. Will tx in labor -pt wants cervix checked at next visit - GC/Chlamydia probe amp ([place])not at [facility]  Term labor symptoms and general obstetric precautions including but not limited to vaginal bleeding, contractions, leaking of fluid and fetal movement were reviewed  in detail with the patient. Please refer to After Visit Summary for other counseling recommendations.  Return in about 1 week (around 08/01/2018) for Routine OB.  Future Appointments  Date Time Provider Department Center  08/01/2018  3:35 PM Eugenio Hoes [facility]  08/08/2018  3:35 PM Armando Reichert, CNM [facility]    Judeth Horn, NP

## 2018-07-25 NOTE — Patient Instructions (Addendum)
Contraception Choices Contraception, also called birth control, refers to methods or devices that prevent pregnancy. Hormonal methods Contraceptive implant  A contraceptive implant is a thin, plastic tube that contains a hormone. It is inserted into the upper part of the arm. It can remain in place for up to 3 years. Progestin-only injections Progestin-only injections are injections of progestin, a synthetic form of the hormone progesterone. They are given every 3 months by a health care provider. Birth control pills  Birth control pills are pills that contain hormones that prevent pregnancy. They must be taken once a day, preferably at the same time each day. Birth control patch  The birth control patch contains hormones that prevent pregnancy. It is placed on the skin and must be changed once a week for three weeks and removed on the fourth week. A prescription is needed to use this method of contraception. Vaginal ring  A vaginal ring contains hormones that prevent pregnancy. It is placed in the vagina for three weeks and removed on the fourth week. After that, the process is repeated with a new ring. A prescription is needed to use this method of contraception. Emergency contraceptive Emergency contraceptives prevent pregnancy after unprotected sex. They come in pill form and can be taken up to 5 days after sex. They work best the sooner they are taken after having sex. Most emergency contraceptives are available without a prescription. This method should not be used as your only form of birth control. Barrier methods Female condom  A female condom is a thin sheath that is worn over the penis during sex. Condoms keep sperm from going inside a woman's body. They can be used with a spermicide to increase their effectiveness. They should be disposed after a single use. Female condom  A female condom is a soft, loose-fitting sheath that is put into the vagina before sex. The condom keeps sperm  from going inside a woman's body. They should be disposed after a single use. Diaphragm  A diaphragm is a soft, dome-shaped barrier. It is inserted into the vagina before sex, along with a spermicide. The diaphragm blocks sperm from entering the uterus, and the spermicide kills sperm. A diaphragm should be left in the vagina for 6-8 hours after sex and removed within 24 hours. A diaphragm is prescribed and fitted by a health care provider. A diaphragm should be replaced every 1-2 years, after giving birth, after gaining more than 15 lb (6.8 kg), and after pelvic surgery. Cervical cap  A cervical cap is a round, soft latex or plastic cup that fits over the cervix. It is inserted into the vagina before sex, along with spermicide. It blocks sperm from entering the uterus. The cap should be left in place for 6-8 hours after sex and removed within 48 hours. A cervical cap must be prescribed and fitted by a health care provider. It should be replaced every 2 years. Sponge  A sponge is a soft, circular piece of polyurethane foam with spermicide on it. The sponge helps block sperm from entering the uterus, and the spermicide kills sperm. To use it, you make it wet and then insert it into the vagina. It should be inserted before sex, left in for at least 6 hours after sex, and removed and thrown away within 30 hours. Spermicides Spermicides are chemicals that kill or block sperm from entering the cervix and uterus. They can come as a cream, jelly, suppository, foam, or tablet. A spermicide should be inserted into the   vagina with an applicator at least 10-15 minutes before sex to allow time for it to work. The process must be repeated every time you have sex. Spermicides do not require a prescription. Intrauterine contraception Intrauterine device (IUD) An IUD is a T-shaped device that is put in a woman's uterus. There are two types:  Hormone IUD.This type contains progestin, a synthetic form of the hormone  progesterone. This type can stay in place for 3-5 years.  Copper IUD.This type is wrapped in copper wire. It can stay in place for 10 years.  Permanent methods of contraception Female tubal ligation In this method, a woman's fallopian tubes are sealed, tied, or blocked during surgery to prevent eggs from traveling to the uterus. Hysteroscopic sterilization In this method, a small, flexible insert is placed into each fallopian tube. The inserts cause scar tissue to form in the fallopian tubes and block them, so sperm cannot reach an egg. The procedure takes about 3 months to be effective. Another form of birth control must be used during those 3 months. Female sterilization This is a procedure to tie off the tubes that carry sperm (vasectomy). After the procedure, the man can still ejaculate fluid (semen). Natural planning methods Natural family planning In this method, a couple does not have sex on days when the woman could become pregnant. Calendar method This means keeping track of the length of each menstrual cycle, identifying the days when pregnancy can happen, and not having sex on those days. Ovulation method In this method, a couple avoids sex during ovulation. Symptothermal method This method involves not having sex during ovulation. The woman typically checks for ovulation by watching changes in her temperature and in the consistency of cervical mucus. Post-ovulation method In this method, a couple waits to have sex until after ovulation. Summary  Contraception, also called birth control, means methods or devices that prevent pregnancy.  Hormonal methods of contraception include implants, injections, pills, patches, vaginal rings, and emergency contraceptives.  Barrier methods of contraception can include female condoms, female condoms, diaphragms, cervical caps, sponges, and spermicides.  There are two types of IUDs (intrauterine devices). An IUD can be put in a woman's uterus to  prevent pregnancy for 3-5 years.  Permanent sterilization can be done through a procedure for males, females, or both.  Natural family planning methods involve not having sex on days when the woman could become pregnant. This information is not intended to replace advice given to you by your health care provider. Make sure you discuss any questions you have with your health care provider. Document Released: 06/22/2005 Document Revised: 06/24/2017 Document Reviewed: 07/25/2016 Elsevier Interactive Patient Education  2019 [employer]       Before Baby Comes Home Once your baby is home with you, things may become a bit hectic as you map out a schedule around your newborn's patterns. Preparing the things you need at home before that time comes is important. Before your baby arrives, make sure you:  Have all the supplies that you will need to care for your baby.  Know where to go if there is an emergency.  Discuss the baby's arrival with other family members. What supplies will I need? Having the following supplies ready before your baby arrives will help ensure that you are prepared: Large items  Crib or bassinet and mattress. Make sure to follow safe sleep recommendations to reduce the risk of sudden infant death syndrome.  Rear-facing infant car seat. Have a trained professional check to  make sure that it is installed in your car correctly. Many hospitals and fire departments perform this service free of charge.  Stroller. Always make sure any products-including cribs, mattresses, bassinets, or portable cribs and play areas-are safe. Check for recalls on your specific brand and model of crib. Breastfeeding  Nursing pillow.  Milk storage containers or bags.  Nipple cream.  Nursing bra.  Breast pads.  Breast pump.  Breast shields. Feeding  Formula.  Purified bottled water.  6-8 bottles (4-5 oz bottles and 8-9 oz bottles).  6-8 bottle nipples.  Bibs and burp  cloths.  Bottle brush.  Bottle sterilizer (or a pot with a lid). Bathing  Infant bath basin.  Mild baby soap and baby shampoo.  Soft cloth towel and washcloth.  Hooded towel. Diapering  Diapers. You may need to use as many as 10-12 diapers each day.  Baby wipes.  Diaper cream.  Petroleum jelly.  Changing pad.  Hand sanitizer. Health and safety  Rectal thermometer.  Infant medicines.  Bulb syringe.  Baby nail clippers.  Baby monitor.  2-3 pacifiers, if desired. Sleeping  Sleep sack or swaddling blanket.  Firm mattress pad and fitted sheets for the crib or bassinet. Other supplies  Diaper bag.  Clothing, including one-piece outfits and pajamas.  Receiving blankets. Follow these instructions at home: Preparing for an emergency Prepare for an emergency by taking these steps:  Know when to seek care or call your health care provider.  Know how to get to the nearest hospital.  List the phone numbers of your baby's health care providers near your home phone and in your cell phone.  Take an infant first aid and CPR class.  Place the phone number for the poison control center on your refrigerator.  If there will be caregivers in the home, make sure your phone number, emergency contacts, and address are placed on the refrigerator in case they need to be given to emergency services. Preparing your family   Create a plan for visitors. Keep your baby away from people who have a cough, fever, or other symptoms of illness.  Prepare freezer meals ahead of time, and ask friends and family to help with meal preparation, errands, and everyday tasks.  If you have other children: ? Talk with them about the baby coming home. Ask them how they feel about it. ? Read a book together about being a new big brother or sister. ? Find ways to let them help you prepare for the new baby. ? Have someone ready to care for them while you are in the hospital. Where to find  more information  Consumer Product Safety Commission: http://johnston-ramirez.com/  American Academy of Pediatrics: www.healthychildren.org  Safe Kids Worldwide: www.safekids.org Summary  Planning is important before bringing your baby home from the hospital. You will need to have certain supplies ready before your baby arrives.  You will need to have a rear-facing infant car seat ready prior to bringing your baby home. Have a trained professional check to make sure that it is installed in your car correctly.  Always make sure any products-including cribs, mattresses, bassinets, or portable cribs and play areas-are safe. Check for recalls on your specific brand and model of crib.  Know when to seek care or call your health care provider, and know how to get to the nearest hospital. This information is not intended to replace advice given to you by your health care provider. Make sure you discuss any questions you have with your  health care provider. Document Released: 06/04/2008 Document Revised: 05/12/2017 Document Reviewed: 05/12/2017 Elsevier Interactive Patient Education  2019 [employer]

## 2018-07-26 LAB — GC/CHLAMYDIA PROBE AMP (~~LOC~~) NOT AT ARMC
Chlamydia: NEGATIVE
Neisseria Gonorrhea: NEGATIVE

## 2018-08-01 ENCOUNTER — Encounter: Payer: Self-pay | Admitting: Advanced Practice Midwife

## 2018-08-01 ENCOUNTER — Ambulatory Visit (INDEPENDENT_AMBULATORY_CARE_PROVIDER_SITE_OTHER): Payer: Medicaid Other | Admitting: Advanced Practice Midwife

## 2018-08-01 VITALS — BP 116/78 | HR 105 | Wt 133.7 lb

## 2018-08-01 DIAGNOSIS — Z3493 Encounter for supervision of normal pregnancy, unspecified, third trimester: Secondary | ICD-10-CM

## 2018-08-01 NOTE — Progress Notes (Signed)
   PRENATAL VISIT NOTE  Subjective:  Christine Martin is a 28 y.o. G2P0010 at [redacted]w[redacted]d being seen today for ongoing prenatal care.  She is currently monitored for the following issues for this low-risk pregnancy and has Supervision of low-risk pregnancy; GBS bacteriuria; Preterm infant; Low lying placenta nos or without hemorrhage, second trimester; and Short cervical length during pregnancy on their problem list.  Patient reports no complaints.  Contractions: Not present. Vag. Bleeding: None.  Movement: Present. Denies leaking of fluid.   The following portions of the patient's history were reviewed and updated as appropriate: allergies, current medications, past family history, past medical history, past social history, past surgical history and problem list. Problem list updated.  Objective:   Vitals:   08/01/18 1542  BP: 116/78  Pulse: (!) 105  Weight: 133 lb 11.2 oz (60.6 kg)    Fetal Status: Fetal Heart Rate (bpm): 136   Movement: Present     General:  Alert, oriented and cooperative. Patient is in no acute distress.  Skin: Skin is warm and dry. No rash noted.   Cardiovascular: Normal heart rate noted  Respiratory: Normal respiratory effort, no problems with respiration noted  Abdomen: Soft, gravid, appropriate for gestational age.  Pain/Pressure: Present     Pelvic: Cervical exam performed        Extremities: Normal range of motion.  Edema: None  Mental Status: Normal mood and affect. Normal behavior. Normal judgment and thought content.    Pt informed that the ultrasound is considered a limited OB ultrasound and is not intended to be a complete ultrasound exam.  Patient also informed that the ultrasound is not being completed with the intent of assessing for fetal or placental anomalies or any pelvic abnormalities.  Explained that the purpose of today's ultrasound is to assess for  presentation.  Patient acknowledges the purpose of the exam and the limitations of the study.     VERTEX Assessment and Plan:  Pregnancy: G2P0010 at [redacted]w[redacted]d  1. Encounter for supervision of low-risk pregnancy in third trimester - routine care  Term labor symptoms and general obstetric precautions including but not limited to vaginal bleeding, contractions, leaking of fluid and fetal movement were reviewed in detail with the patient. Please refer to After Visit Summary for other counseling recommendations.  Return in about 1 week (around 08/08/2018).  Future Appointments  Date Time Provider Department Center  08/08/2018  3:35 PM Armando Reichert, CNM [facility]    Thressa Sheller DNP, CNM  08/01/18  3:50 PM

## 2018-08-01 NOTE — Patient Instructions (Signed)
Vaginal delivery means that you give birth by pushing your baby out of your birth canal (vagina). A team of health care providers will help you before, during, and after vaginal delivery. Birth experiences are unique for every woman and every pregnancy, and birth experiences vary depending on where you choose to give birth. What happens when I arrive at the birth center or hospital? Once you are in labor and have been admitted into the hospital or birth center, your health care provider may:  Review your pregnancy history and any concerns that you have.  Insert an IV into one of your veins. This may be used to give you fluids and medicines.  Check your blood pressure, pulse, temperature, and heart rate (vital signs).  Check whether your bag of water (amniotic sac) has broken (ruptured).  Talk with you about your birth plan and discuss pain control options. Monitoring Your health care provider may monitor your contractions (uterine monitoring) and your baby's heart rate (fetal monitoring). You may need to be monitored:  Often, but not continuously (intermittently).  All the time or for long periods at a time (continuously). Continuous monitoring may be needed if: ? You are taking certain medicines, such as medicine to relieve pain or make your contractions stronger. ? You have pregnancy or labor complications. Monitoring may be done by:  Placing a special stethoscope or a handheld monitoring device on your abdomen to check your baby's heartbeat and to check for contractions.  Placing monitors on your abdomen (external monitors) to record your baby's heartbeat and the frequency and length of contractions.  Placing monitors inside your uterus through your vagina (internal monitors) to record your baby's heartbeat and the frequency, length, and strength of your contractions. Depending on the type of monitor, it may remain in your uterus or on your baby's head until birth.  Telemetry. This is  a type of continuous monitoring that can be done with external or internal monitors. Instead of having to stay in bed, you are able to move around during telemetry. Physical exam Your health care provider may perform frequent physical exams. This may include:  Checking how and where your baby is positioned in your uterus.  Checking your cervix to determine: ? Whether it is thinning out (effacing). ? Whether it is opening up (dilating). What happens during labor and delivery?  Normal labor and delivery is divided into the following three stages: Stage 1  This is the longest stage of labor.  This stage can last for hours or days.  Throughout this stage, you will feel contractions. Contractions generally feel mild, infrequent, and irregular at first. They get stronger, more frequent (about every 2-3 minutes), and more regular as you move through this stage.  This stage ends when your cervix is completely dilated to 4 inches (10 cm) and completely effaced. Stage 2  This stage starts once your cervix is completely effaced and dilated and lasts until the delivery of your baby.  This stage may last from 20 minutes to 2 hours.  This is the stage where you will feel an urge to push your baby out of your vagina.  You may feel stretching and burning pain, especially when the widest part of your baby's head passes through the vaginal opening (crowning).  Once your baby is delivered, the umbilical cord will be clamped and cut. This usually occurs after waiting a period of 1-2 minutes after delivery.  Your baby will be placed on your bare chest (skin-to-skin contact) in   an upright position and covered with a warm blanket. Watch your baby for feeding cues, like rooting or sucking, and help the baby to your breast for his or her first feeding. Stage 3  This stage starts immediately after the birth of your baby and ends after you deliver the placenta.  This stage may take anywhere from 5 to 30  minutes.  After your baby has been delivered, you will feel contractions as your body expels the placenta and your uterus contracts to control bleeding. What can I expect after labor and delivery?  After labor is over, you and your baby will be monitored closely until you are ready to go home to ensure that you are both healthy. Your health care team will teach you how to care for yourself and your baby.  You and your baby will stay in the same room (rooming in) during your hospital stay. This will encourage early bonding and successful breastfeeding.  You may continue to receive fluids and medicines through an IV.  Your uterus will be checked and massaged regularly (fundal massage).  You will have some soreness and pain in your abdomen, vagina, and the area of skin between your vaginal opening and your anus (perineum).  If an incision was made near your vagina (episiotomy) or if you had some vaginal tearing during delivery, cold compresses may be placed on your episiotomy or your tear. This helps to reduce pain and swelling.  You may be given a squirt bottle to use instead of wiping when you go to the bathroom. To use the squirt bottle, follow these steps: ? Before you urinate, fill the squirt bottle with warm water. Do not use hot water. ? After you urinate, while you are sitting on the toilet, use the squirt bottle to rinse the area around your urethra and vaginal opening. This rinses away any urine and blood. ? Fill the squirt bottle with clean water every time you use the bathroom.  It is normal to have vaginal bleeding after delivery. Wear a sanitary pad for vaginal bleeding and discharge. Summary  Vaginal delivery means that you will give birth by pushing your baby out of your birth canal (vagina).  Your health care provider may monitor your contractions (uterine monitoring) and your baby's heart rate (fetal monitoring).  Your health care provider may perform a physical  exam.  Normal labor and delivery is divided into three stages.  After labor is over, you and your baby will be monitored closely until you are ready to go home. This information is not intended to replace advice given to you by your health care provider. Make sure you discuss any questions you have with your health care provider. Document Released: 03/31/2008 Document Revised: 07/27/2017 Document Reviewed: 07/27/2017 Elsevier Interactive Patient Education  2019 [employer]vier Inc.  

## 2018-08-07 ENCOUNTER — Encounter (HOSPITAL_COMMUNITY): Payer: Self-pay | Admitting: Emergency Medicine

## 2018-08-07 ENCOUNTER — Other Ambulatory Visit: Payer: Self-pay

## 2018-08-07 ENCOUNTER — Inpatient Hospital Stay (HOSPITAL_COMMUNITY)
Admission: AD | Admit: 2018-08-07 | Discharge: 2018-08-09 | DRG: 807 | Disposition: A | Discharge status: Home / Self Care | Payer: Medicaid Other | Attending: Obstetrics and Gynecology | Admitting: Obstetrics and Gynecology

## 2018-08-07 DIAGNOSIS — Z3483 Encounter for supervision of other normal pregnancy, third trimester: Secondary | ICD-10-CM | POA: Diagnosis present

## 2018-08-07 DIAGNOSIS — Z87891 Personal history of nicotine dependence: Secondary | ICD-10-CM | POA: Diagnosis not present

## 2018-08-07 DIAGNOSIS — Z3A38 38 weeks gestation of pregnancy: Secondary | ICD-10-CM

## 2018-08-07 DIAGNOSIS — R8271 Bacteriuria: Secondary | ICD-10-CM | POA: Diagnosis present

## 2018-08-07 DIAGNOSIS — O99824 Streptococcus B carrier state complicating childbirth: Secondary | ICD-10-CM | POA: Diagnosis present

## 2018-08-07 LAB — CBC
HCT: 33 % — ABNORMAL LOW (ref 36.0–46.0)
Hemoglobin: 11 g/dL — ABNORMAL LOW (ref 12.0–15.0)
MCH: 32.1 pg (ref 26.0–34.0)
MCHC: 33.3 g/dL (ref 30.0–36.0)
MCV: 96.2 fL (ref 80.0–100.0)
Platelets: 204 10*3/uL (ref 150–400)
RBC: 3.43 MIL/uL — ABNORMAL LOW (ref 3.87–5.11)
RDW: 13 % (ref 11.5–15.5)
WBC: 9.7 10*3/uL (ref 4.0–10.5)
nRBC: 0.3 % — ABNORMAL HIGH (ref 0.0–0.2)

## 2018-08-07 LAB — POCT FERN TEST: POCT Fern Test: POSITIVE

## 2018-08-07 LAB — ABO/RH: ABO/RH(D): O POS

## 2018-08-07 LAB — TYPE AND SCREEN
ABO/RH(D): O POS
Antibody Screen: NEGATIVE

## 2018-08-07 LAB — RPR: RPR Ser Ql: NONREACTIVE

## 2018-08-07 MED ORDER — SENNOSIDES-DOCUSATE SODIUM 8.6-50 MG PO TABS
2.0000 | ORAL_TABLET | ORAL | Status: DC
  Administered 2018-08-08 (×2): 2 via ORAL
  Filled 2018-08-07 (×2): qty 2

## 2018-08-07 MED ORDER — LACTATED RINGERS IV SOLN: 500.0000 mL | INTRAVENOUS | Status: DC | PRN

## 2018-08-07 MED ORDER — SOD CITRATE-CITRIC ACID 500-334 MG/5ML PO SOLN: 30.0000 mL | ORAL | Status: DC | PRN

## 2018-08-07 MED ORDER — BENZOCAINE-MENTHOL 20-0.5 % EX AERO: 1.0000 "application " | INHALATION_SPRAY | CUTANEOUS | Status: DC | PRN

## 2018-08-07 MED ORDER — OXYTOCIN BOLUS FROM INFUSION
500.0000 mL | Freq: Once | INTRAVENOUS | Status: AC
  Administered 2018-08-07: 500 mL via INTRAVENOUS

## 2018-08-07 MED ORDER — WITCH HAZEL-GLYCERIN EX PADS: 1.0000 "application " | MEDICATED_PAD | CUTANEOUS | Status: DC | PRN

## 2018-08-07 MED ORDER — ONDANSETRON HCL 4 MG/2ML IJ SOLN: 4.0000 mg | Freq: Four times a day (QID) | INTRAMUSCULAR | Status: DC | PRN

## 2018-08-07 MED ORDER — PRENATAL MULTIVITAMIN CH
1.0000 | ORAL_TABLET | Freq: Every day | ORAL | Status: DC
  Administered 2018-08-07 – 2018-08-09 (×3): 1 via ORAL
  Filled 2018-08-07 (×3): qty 1

## 2018-08-07 MED ORDER — ACETAMINOPHEN 325 MG PO TABS: 650.0000 mg | ORAL_TABLET | ORAL | Status: DC | PRN

## 2018-08-07 MED ORDER — TETANUS-DIPHTH-ACELL PERTUSSIS 5-2.5-18.5 LF-MCG/0.5 IM SUSP: 0.5000 mL | Freq: Once | INTRAMUSCULAR | Status: DC

## 2018-08-07 MED ORDER — LACTATED RINGERS IV SOLN
INTRAVENOUS | Status: DC
  Administered 2018-08-07: 03:00:00 via INTRAVENOUS

## 2018-08-07 MED ORDER — OXYTOCIN 40 UNITS IN NORMAL SALINE INFUSION - SIMPLE MED
2.5000 [IU]/h | INTRAVENOUS | Status: DC
  Administered 2018-08-07: 2.5 [IU]/h via INTRAVENOUS
  Filled 2018-08-07: qty 1000

## 2018-08-07 MED ORDER — DIPHENHYDRAMINE HCL 25 MG PO CAPS: 25.0000 mg | ORAL_CAPSULE | Freq: Four times a day (QID) | ORAL | Status: DC | PRN

## 2018-08-07 MED ORDER — ONDANSETRON HCL 4 MG PO TABS: 4.0000 mg | ORAL_TABLET | ORAL | Status: DC | PRN

## 2018-08-07 MED ORDER — FENTANYL CITRATE (PF) 100 MCG/2ML IJ SOLN: 100.0000 ug | INTRAMUSCULAR | Status: DC | PRN

## 2018-08-07 MED ORDER — SODIUM CHLORIDE 0.9 % IV SOLN
1.0000 g | INTRAVENOUS | Status: DC
  Filled 2018-08-07 (×2): qty 1000

## 2018-08-07 MED ORDER — ONDANSETRON HCL 4 MG/2ML IJ SOLN: 4.0000 mg | INTRAMUSCULAR | Status: DC | PRN

## 2018-08-07 MED ORDER — DIBUCAINE 1 % RE OINT: 1.0000 "application " | TOPICAL_OINTMENT | RECTAL | Status: DC | PRN

## 2018-08-07 MED ORDER — SIMETHICONE 80 MG PO CHEW: 80.0000 mg | CHEWABLE_TABLET | ORAL | Status: DC | PRN

## 2018-08-07 MED ORDER — MEASLES, MUMPS & RUBELLA VAC IJ SOLR
0.5000 mL | Freq: Once | INTRAMUSCULAR | Status: DC
  Filled 2018-08-07: qty 0.5

## 2018-08-07 MED ORDER — SODIUM CHLORIDE 0.9 % IV SOLN
2.0000 g | Freq: Once | INTRAVENOUS | Status: AC
  Administered 2018-08-07: 2 g via INTRAVENOUS
  Filled 2018-08-07: qty 2

## 2018-08-07 MED ORDER — LIDOCAINE HCL (PF) 1 % IJ SOLN
30.0000 mL | INTRAMUSCULAR | Status: DC | PRN
  Filled 2018-08-07: qty 30

## 2018-08-07 MED ORDER — IBUPROFEN 600 MG PO TABS
600.0000 mg | ORAL_TABLET | Freq: Four times a day (QID) | ORAL | Status: DC
  Administered 2018-08-07 – 2018-08-09 (×9): 600 mg via ORAL
  Filled 2018-08-07 (×9): qty 1

## 2018-08-07 MED ORDER — COCONUT OIL OIL: 1.0000 "application " | TOPICAL_OIL | Status: DC | PRN

## 2018-08-07 NOTE — Discharge Summary (Addendum)
Postpartum Discharge Summary     Patient Name: Christine Martin DOB: 01-29-1991 MRN: [id number]  Date of admission: 08/07/2018 Delivering Provider: Gwenevere Abbot   Date of discharge: 08/09/2018  Admitting diagnosis: 38 wk labor check Intrauterine pregnancy: [redacted]w[redacted]d     Secondary diagnosis:  Active Problems:   GBS bacteriuria   Normal labor   Precipitous delivery, delivered (current hospitalization)  Additional problems: None     Discharge diagnosis: Term Pregnancy Delivered                                                                                                Post partum procedures:None  Augmentation: None  Complications: None  Hospital course:  Onset of Labor With Vaginal Delivery     28 y.o. yo G2P1011 at [redacted]w[redacted]d was admitted in Active Labor on 08/07/2018. Patient had an uncomplicated labor course as follows:  Membrane Rupture Time/Date: 1:30 AM ,08/07/2018   Intrapartum Procedures: Episiotomy: None [1]                                         Lacerations:  None [1]  Patient had a delivery of a Viable infant. 08/07/2018  Information for the patient's newborn:  Josepha, Tousey [[id number]]      Patient had an uncomplicated postpartum course.  She is ambulating, tolerating a regular diet, passing flatus, and urinating well. Patient is discharged home in stable condition on 08/09/18.   Magnesium Sulfate recieved: No BMZ received: No  Physical exam  Vitals:   08/08/18 0601 08/08/18 1503 08/08/18 2231 08/09/18 0644  BP: 112/65 98/72 114/70 111/68  Pulse: 92 91 92 84  Resp: 17 18 18    Temp: 98.6 F (37 C) 98.2 F (36.8 C) 98.2 F (36.8 C) 98.3 F (36.8 C)  TempSrc: Oral Oral Oral Oral  SpO2: 100% 99%  100%  Weight:      Height:       General: alert, cooperative and no distress Lochia: appropriate Uterine Fundus: firm Incision: N/A DVT Evaluation: No evidence of DVT seen on physical exam. Labs: Lab Results  Component Value Date   WBC 9.7 08/07/2018   HGB  11.0 (L) 08/07/2018   HCT 33.0 (L) 08/07/2018   MCV 96.2 08/07/2018   PLT 204 08/07/2018   CMP Latest Ref Rng & Units 12/28/2017  Glucose 70 - 99 mg/dL 82  BUN 6 - 20 mg/dL 8  Creatinine 9.21 - 1.94 mg/dL 1.74  Sodium 081 - 448 mmol/L 138  Potassium 3.5 - 5.1 mmol/L 4.3  Chloride 98 - 111 mmol/L 106  CO2 22 - 32 mmol/L 23  Calcium 8.9 - 10.3 mg/dL 9.5  Total Protein 6.5 - 8.1 g/dL 1.8(H)  Total Bilirubin 0.3 - 1.2 mg/dL 2.3(H)  Alkaline Phos 38 - 126 U/L 49  AST 15 - 41 U/L 29  ALT 0 - 44 U/L 14   Discharge instruction: per After Visit Summary and "Baby and Me Booklet".  After visit meds:  Allergies as of 08/09/2018  No Known Allergies     Medication List    STOP taking these medications   progesterone 200 MG capsule Commonly known as:  PROMETRIUM     TAKE these medications   acetaminophen 500 MG tablet Commonly known as:  TYLENOL Take 500 mg by mouth every 6 (six) hours as needed for moderate pain or headache.   ibuprofen 600 MG tablet Commonly known as:  ADVIL,MOTRIN Take 1 tablet (600 mg total) by mouth every 6 (six) hours.   PREPLUS 27-1 MG Tabs Take 1 tablet by mouth daily.   senna-docusate 8.6-50 MG tablet Commonly known as:  Senokot-S Take 2 tablets by mouth daily. Start taking on:  August 10, 2018       Diet: routine diet  Activity: Advance as tolerated. Pelvic rest for 6 weeks.   Outpatient follow up:6 weeks Follow up Appt: Future Appointments  Date Time Provider Department Center  09/06/2018  2:15 PM Gwenevere Abbot, MD [facility]   Follow up Visit: Please schedule this patient for Postpartum visit in: 6 weeks with the following provider: Any provider  For C/S patients schedule nurse incision check in weeks 2 weeks: no  Low risk pregnancy complicated by: None  Delivery mode: SVD  Anticipated Birth Control: Nexplanon or IUD  PP Procedures needed: none  Schedule Integrated BH visit: no   Newborn Data: Live born female  Birth Weight: 8  lb 2.5 oz (3700 g) APGAR: 7, 8  Newborn Delivery   Birth date/time:  08/07/2018 05:23:00 Delivery type:  Vaginal, Spontaneous     Baby Feeding: Breast Disposition:home with mother   08/09/2018 Dollene Cleveland, DO  OB FELLOW DISCHARGE ATTESTATION  I have seen and examined this patient and agree with above documentation in the resident's note.   Gwenevere Abbot, MD  OB Fellow  08/09/2018, 10:12 AM

## 2018-08-07 NOTE — H&P (Signed)
LABOR AND DELIVERY ADMISSION HISTORY AND PHYSICAL NOTE  Christine Martin is a 28 y.o. female G2P0010 with IUP at [redacted]w[redacted]d by 6w [place] presenting for SOL. Contractions started at 11pm on the night prior to admission. Water broke around 1:30am. Contractions have become more frequent and painful.  She reports positive fetal movement. She denies leakage of fluid or vaginal bleeding.  Prenatal History/Complications: PNC at [facility] Pregnancy complications:  - GBS bacteruria  Past Medical History: Past Medical History:  Diagnosis Date  . Anemia   . Hx of chlamydia infection 08/2017  . Hx of chlamydia infection 08/2017  . Preterm infant    pt states she was born preterm but doesn't know how many weeks. Weighed 3 lbs    Past Surgical History: Past Surgical History:  Procedure Laterality Date  . THERAPEUTIC ABORTION  08/2017    Obstetrical History: OB History    Gravida  2   Para      Term      Preterm      AB  1   Living  0     SAB      TAB  1   Ectopic      Multiple      Live Births              Social History: Social History   Socioeconomic History  . Marital status: Single    Spouse name: Not on file  . Number of children: Not on file  . Years of education: Not on file  . Highest education level: Not on file  Occupational History  . Not on file  Social Needs  . Financial resource strain: Not on file  . Food insecurity:    Worry: Never true    Inability: Never true  . Transportation needs:    Medical: No    Non-medical: No  Tobacco Use  . Smoking status: Former Smoker    Last attempt to quit: 11/02/2017    Years since quitting: 0.7  . Smokeless tobacco: Never Used  . Tobacco comment: Marijuana previously  Substance and Sexual Activity  . Alcohol use: Not Currently    Frequency: Never    Comment: social  . Drug use: Not Currently    Types: Marijuana  . Sexual activity: Yes    Birth control/protection: None  Lifestyle  . Physical activity:    Days per  week: Not on file    Minutes per session: Not on file  . Stress: Not on file  Relationships  . Social connections:    Talks on phone: Not on file    Gets together: Not on file    Attends religious service: Not on file    Active member of club or organization: Not on file    Attends meetings of clubs or organizations: Not on file    Relationship status: Not on file  Other Topics Concern  . Not on file  Social History Narrative  . Not on file    Family History: Family History  Problem Relation Age of Onset  . Lupus Mother   . Rheum arthritis Mother     Allergies: No Known Allergies  Medications Prior to Admission  Medication Sig Dispense Refill Last Dose  . acetaminophen (TYLENOL) 500 MG tablet Take 500 mg by mouth every 6 (six) hours as needed.   Taking  . Prenatal Vit-Fe Fumarate-FA (PREPLUS) 27-1 MG TABS Take 1 tablet by mouth daily.   Taking  . progesterone (PROMETRIUM) 200 MG  capsule Place one capsule vaginally at bedtime 30 capsule 3 Taking     Review of Systems  All systems reviewed and negative except as stated in HPI  Physical Exam Blood pressure 115/78, pulse 92, temperature (!) 97.5 F (36.4 C), temperature source Oral, resp. rate 20, height 5' (1.524 m), weight 60.3 kg, last menstrual period 11/06/2017. General appearance: alert, oriented, NAD Lungs: normal respiratory effort Heart: regular rate Abdomen: soft, non-tender; gravid, FH appropriate for GA Extremities: No calf swelling or tenderness Presentation: cephalic Fetal monitoring: 130/mod var/+ accels/no decels Uterine activity: regular q3-63m Dilation: 9 Effacement (%): 90 Station: 0, Plus 1 Exam by:: Suezanne Jacquet, RN  Prenatal labs: ABO, Rh: --/--/O POS (02/02 0231) Antibody: PENDING (02/02 0231) Rubella: 3.02 (07/29 1618) RPR: Non Reactive (12/16 1011)  HBsAg: Negative (07/29 1618)  HIV: Non Reactive (12/16 1011)  GC/Chlamydia: negative GBS:   positive 2-hr GTT: 66/87/82 Genetic  screening:  declined Anatomy US: normal  Prenatal Transfer Tool  Maternal Diabetes: No Genetic Screening: Declined Maternal Ultrasounds/Referrals: Normal Fetal Ultrasounds or other Referrals:  None Maternal Substance Abuse:  No Significant Maternal Medications:  None Significant Maternal Lab Results: None  Results for orders placed or performed during the hospital encounter of 08/07/18 (from the past 24 hour(s))  Fern Test   Collection Time: 08/07/18  2:29 AM  Result Value Ref Range   POCT Fern Test Positive = ruptured amniotic membanes   CBC   Collection Time: 08/07/18  2:31 AM  Result Value Ref Range   WBC 9.7 4.0 - 10.5 K/uL   RBC 3.43 (L) 3.87 - 5.11 MIL/uL   Hemoglobin 11.0 (L) 12.0 - 15.0 g/dL   HCT 71.2 (L) 45.8 - 09.9 %   MCV 96.2 80.0 - 100.0 fL   MCH 32.1 26.0 - 34.0 pg   MCHC 33.3 30.0 - 36.0 g/dL   RDW 83.3 82.5 - 05.3 %   Platelets 204 150 - 400 K/uL   nRBC 0.3 (H) 0.0 - 0.2 %  Type and screen [facility]   Collection Time: 08/07/18  2:31 AM  Result Value Ref Range   ABO/RH(D) O POS    Antibody Screen PENDING    Sample Expiration      08/10/2018 Performed at [facility], [address], [place], [place] [address]     Patient Active Problem List   Diagnosis Date Noted  . Normal labor 08/07/2018  . Short cervical length during pregnancy 04/27/2018  . Low lying placenta nos or without hemorrhage, second trimester 03/25/2018  . Supervision of low-risk pregnancy 01/31/2018  . GBS bacteriuria 01/31/2018  . Preterm infant     Assessment: Christine Martin is a 28 y.o. G2P0010 at [redacted]w[redacted]d here for SOL/SROM  #Labor: progressing without augmentation. Expectant management #Pain: None #FWB: Cat 1 #ID:  GBS +; ampicillin  #MOF: Breast #MOC: LARC #Circ:  Outpatient  Gwenevere Abbot, MD  08/07/2018, 3:14 AM

## 2018-08-07 NOTE — Lactation Note (Signed)
This note was copied from a baby's chart. Lactation Consultation Note  Patient Name: Christine Martin Date: 08/07/2018 Reason for consult: Initial assessment;Primapara;1st time breastfeeding;Early term 34-38.6wks  6 hours old early term female who is being exclusively BF by his mother, she's a P1. Mom participated in the [facility] program at the [facility] during the pregnancy, and she's already familiar with hand expression. Her RN Asher Muir has been very proactive and showing her the techniques, when [facility] reviewed hand expression with mom she was able to get two big droplets of colostrum out of her right breast, no colostrum observed on the left one yet. LC rubbed in baby's mouth.  Offered assistance with latch but mom politely declined, baby was asleep. She states that feedings at the breast are comfortable and both of her nipples looked intact upon examination. Mom feels pretty confident about BF so far, asked her to call for assistance when needed. Discussed feeding cues, normal newborn behavior and cluster feeding.  Feeding plan:  1. Encouraged mom to feed baby STS 8-12 times/24 hours or sooner if feeding cues are present 2. Hand expression and spoon feeding was also encouraged  BF brochure, BF resources and feeding diary were reviewed. Mom reported all questions and concerns were answered, she's aware of LC services and will call PRN.  Maternal Data Formula Feeding for Exclusion: No Has patient been taught Hand Expression?: Yes Does the patient have breastfeeding experience prior to this delivery?: No  Feeding Feeding Type: Breast Fed  LATCH Score Latch: Grasps breast easily, tongue down, lips flanged, rhythmical sucking.  Audible Swallowing: Spontaneous and intermittent  Type of Nipple: Everted at rest and after stimulation  Comfort (Breast/Nipple): Soft / non-tender  Hold (Positioning): Assistance needed to correctly position infant at breast and maintain latch.  LATCH Score:  9  Interventions Interventions: Breast feeding basics reviewed;Breast massage;Hand express;Breast compression  Lactation Tools Discussed/Used WIC Program: Yes   Consult Status Consult Status: Follow-up Date: 08/08/18 Follow-up type: In-patient    Daleigh Pollinger Venetia Constable 08/07/2018, 11:34 AM

## 2018-08-07 NOTE — Progress Notes (Signed)
Dr Aneta MinsPhillip notified of pt's admission and status. [facility]

## 2018-08-08 ENCOUNTER — Encounter: Payer: Self-pay | Admitting: Advanced Practice Midwife

## 2018-08-08 NOTE — Progress Notes (Signed)
Post Partum Day 1,  uncomplicated SVD Subjective:  no complaints, up ad lib, voiding, tolerating PO, + flatus and Patient was pleasant and cooperative, She denies SOB, HA, vision changes, or feeling dizzy.  No pedal edema noted, mimimal bleeding and cramping.  Objective: Blood pressure 112/65, pulse 92, temperature 98.6 F (37 C), temperature source Oral, resp. rate 17, height 5' (1.524 m), weight 60.3 kg, last menstrual period 11/06/2017, SpO2 100 %, unknown if currently breastfeeding.  Physical Exam:  General: alert, cooperative, appears stated age and no distress Lochia: appropriate Uterine Fundus: firm Incision: N/A DVT Evaluation: No evidence of DVT seen on physical exam.  Recent Labs    08/07/18 0231  HGB 11.0*  HCT 33.0*    Assessment/Plan: Plan for discharge tomorrow, Breastfeeding and Contraception educated patient about LARC and will follow up on patients decision.    LOS: 1 day   Christine Martin 08/08/2018, 7:44 AM

## 2018-08-08 NOTE — Lactation Note (Signed)
This note was copied from a baby's chart. Lactation Consultation Note  Patient Name: Christine Martin [id number] Date: 08/08/2018 Reason for consult: Follow-up assessment;1st time breastfeeding;Primapara;Early term 37-38.6wks  P1 mother whose infant is now 88 hours old.  This is an ETI at 38+2 weeks.  Baby was swaddled and sleeping in father's arms when I arrived.  Mother had no questions/concerns related to breast feeding at this time.  She stated that he seems to be latching well and that he is quiet and content after feedings.  She does have some difficulty with latching to the right breast at times.  I encouraged her to place him at the right breast at the beginning of the next couple feedings and to call for latch assistance if needed.  She may also try a different position.  Mother denies pain with latching.  She will call as needed.  Family members present.     Maternal Data Formula Feeding for Exclusion: No Has patient been taught Hand Expression?: Yes Does the patient have breastfeeding experience prior to this delivery?: No  Feeding    LATCH Score                   Interventions    Lactation Tools Discussed/Used WIC Program: Yes   Consult Status Consult Status: Follow-up Date: 08/09/18 Follow-up type: In-patient    Lizzie An R Doylene Splinter 08/08/2018, 12:09 PM

## 2018-08-09 MED ORDER — IBUPROFEN 600 MG PO TABS: 600.0000 mg | ORAL_TABLET | Freq: Four times a day (QID) | ORAL | 0 refills | Status: DC

## 2018-08-09 MED ORDER — SENNOSIDES-DOCUSATE SODIUM 8.6-50 MG PO TABS: 2.0000 | ORAL_TABLET | ORAL | 0 refills | Status: DC

## 2018-08-09 NOTE — Lactation Note (Signed)
This note was copied from a baby's chart. Lactation Consultation Note  Patient Name: Boy Nadeen LandauKiara Langsam ZOXWR'UToday's Date: 08/09/2018 Reason for consult: Follow-up assessment;Early term 337-38.6wks Baby is 1552 hours old and at a 4% weight loss.  Mom reports feedings are going well.  Discussed milk coming to volume and the prevention and treatment of engorgement.  Hand pump given with instructions on use, cleaning and EBM storage.  Mom denies questions or concerns.  Lactation outpatient services reviewed and encouraged prn.  Maternal Data    Feeding    LATCH Score                   Interventions Interventions: Hand pump  Lactation Tools Discussed/Used     Consult Status Consult Status: Complete Follow-up type: Call as needed    Huston FoleyMOULDEN, Gracin Soohoo S 08/09/2018, 9:44 AM

## 2018-09-06 ENCOUNTER — Ambulatory Visit (INDEPENDENT_AMBULATORY_CARE_PROVIDER_SITE_OTHER): Payer: Medicaid Other | Admitting: Family Medicine

## 2018-09-06 ENCOUNTER — Encounter: Payer: Self-pay | Admitting: Family Medicine

## 2018-09-06 DIAGNOSIS — Z1389 Encounter for screening for other disorder: Secondary | ICD-10-CM | POA: Diagnosis not present

## 2018-09-06 DIAGNOSIS — Z30017 Encounter for initial prescription of implantable subdermal contraceptive: Secondary | ICD-10-CM | POA: Diagnosis not present

## 2018-09-06 DIAGNOSIS — Z8619 Personal history of other infectious and parasitic diseases: Secondary | ICD-10-CM

## 2018-09-06 MED ORDER — ETONOGESTREL 68 MG ~~LOC~~ IMPL
68.0000 mg | DRUG_IMPLANT | Freq: Once | SUBCUTANEOUS | Status: AC
  Administered 2018-09-06: 68 mg via SUBCUTANEOUS

## 2018-09-06 NOTE — Progress Notes (Signed)
Subjective:     Christine Martin is a 28 y.o. female who presents for a postpartum visit. She is 4 weeks postpartum following a spontaneous vaginal delivery. I have fully reviewed the prenatal and intrapartum course. The delivery was at 38/2 gestational weeks. Outcome: spontaneous vaginal delivery. Anesthesia: none. Postpartum course has been uncomplicated. Baby's course has been uncomplicated. Baby is feeding by breast. Bleeding no bleeding. Bowel function is normal. Bladder function is normal. Patient is not sexually active. Contraception method is Nexplanon. Postpartum depression screening: negative.  Review of Systems A comprehensive review of systems was negative.   Objective:    LMP 11/06/2017 (Approximate)   General:  alert, cooperative, appears stated age and no distress   Breasts:  deferred  Lungs: nonlabored breathing, normal respiratory effort  Heart:  regular rate and rhythm  Abdomen: soft, non-tender; bowel sounds normal; no masses,  no organomegaly  GU: deferred  Psych:  Normal mood, affect, behavior and judgement  Neuro: Alert and oriented x 4, normal gait, no cranial nerve abnormalities        Assessment:     Normal postpartum exam. Pap smear not done at today's visit.   Plan:    1. Contraception: Nexplanon 2. Doing really well! Baby weighs 12lbs! 3. Follow up in: 3 years or as needed.    Nexplanon Insertion Procedure Patient identified, informed consent performed, consent signed.   Patient does understand that irregular bleeding is a very common side effect of this medication. She was advised to have backup contraception for one week after placement. Pregnancy test in clinic today was negative.  Appropriate time out taken.  Patient's left arm was prepped and draped in the usual sterile fashion. The ruler used to measure and mark insertion area.  Patient was prepped with alcohol swab and then injected with 3 ml of 1% lidocaine.  She was prepped with betadine, Nexplanon  removed from packaging,  Device confirmed in needle, then inserted full length of needle and withdrawn per handbook instructions. Nexplanon was able to palpated in the patient's arm; patient palpated the insert herself. There was minimal blood loss.  Patient insertion site covered with guaze and a pressure bandage to reduce any bruising.  The patient tolerated the procedure well and was given post procedure instructions.   Gwenevere Abbot, MD  Ob Fellow, [facility] for [employer], [facility]

## 2018-09-07 LAB — POCT PREGNANCY, URINE: PREG TEST UR: NEGATIVE

## 2018-09-15 DIAGNOSIS — Z8619 Personal history of other infectious and parasitic diseases: Secondary | ICD-10-CM | POA: Insufficient documentation

## 2018-09-28 ENCOUNTER — Encounter: Payer: Self-pay | Admitting: *Deleted

## 2018-11-22 IMAGING — US US MFM OB TRANSVAGINAL
1 series · 13 of 28 positions shown · non-contrast
Comparison: none

[Series 1: us mfm ob transvaginal · 13 of 109 slices shown]
[im 5/109]
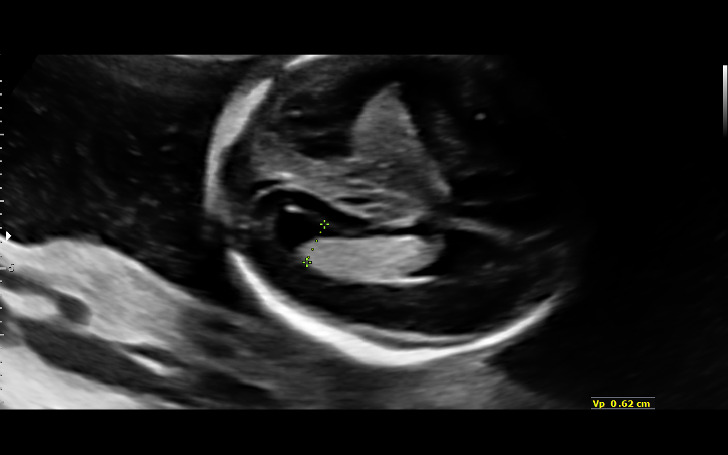
[im 13/109]
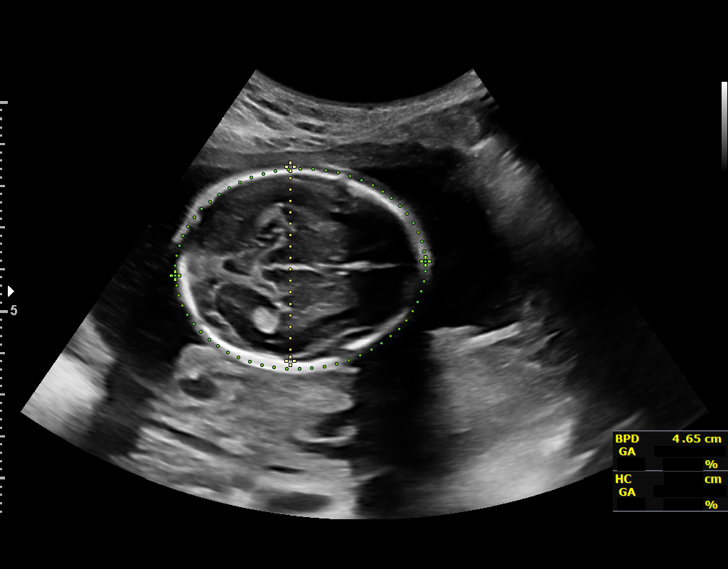
[im 21/109]
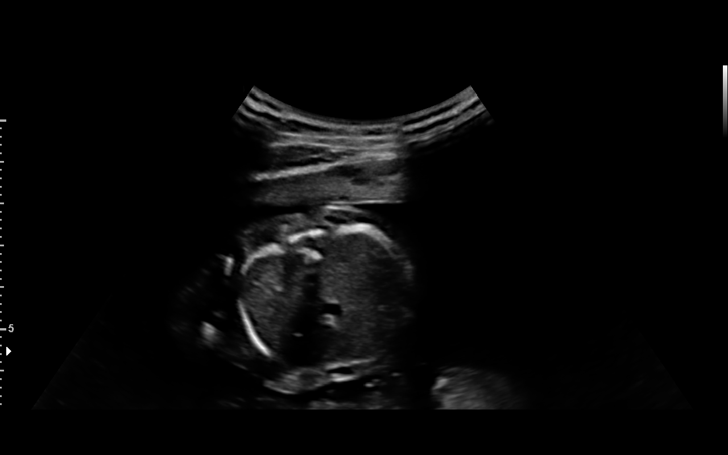
[im 29/109]
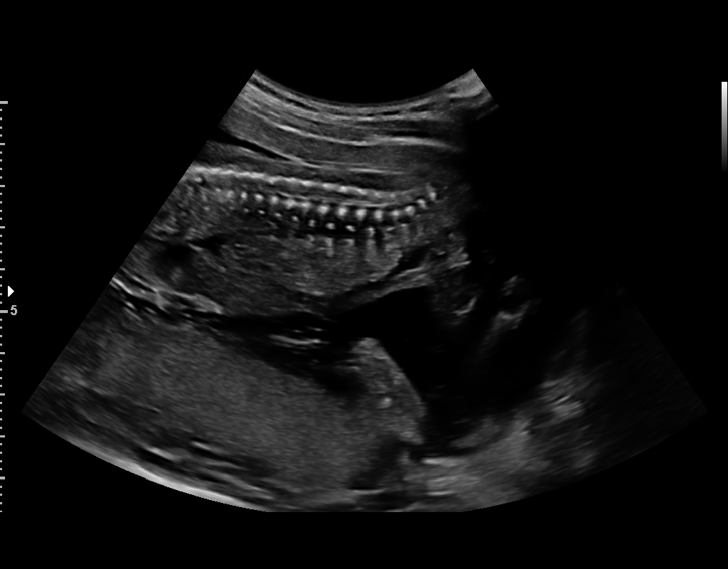
[im 37/109]
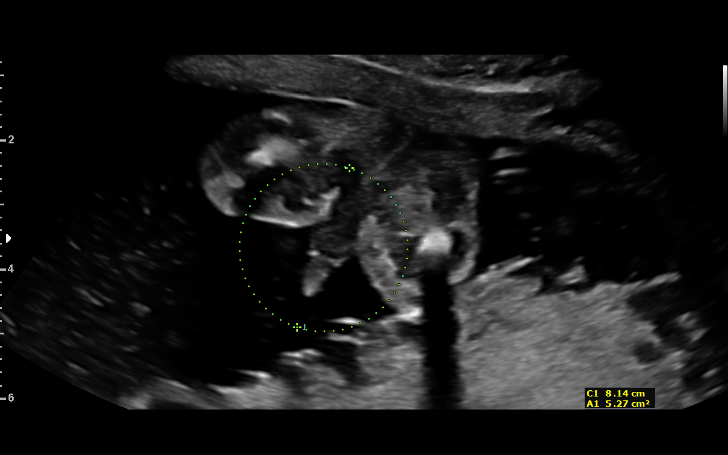
[im 45/109]
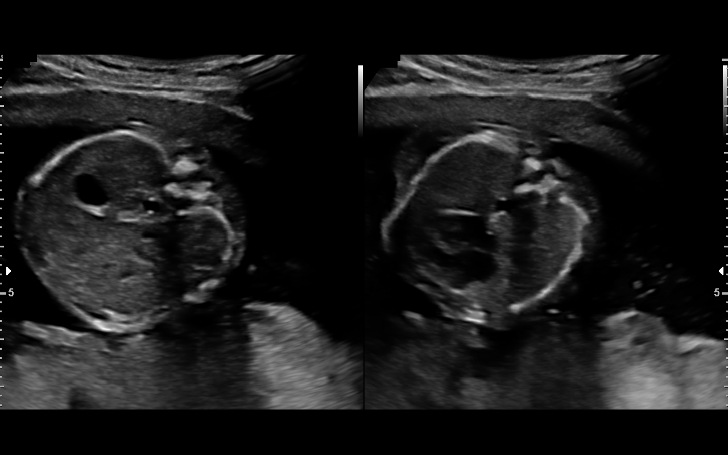
[im 57/109]
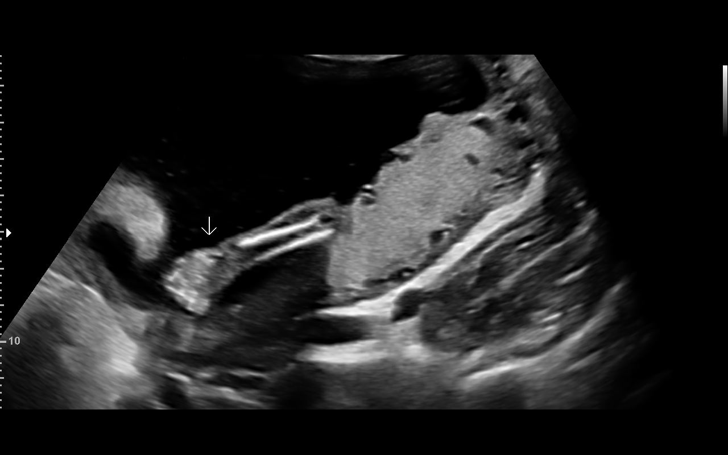
[im 65/109]
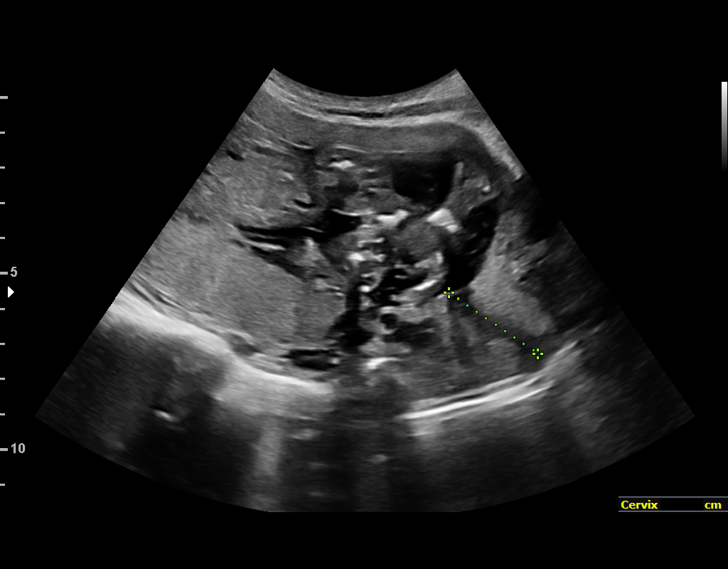
[im 73/109]
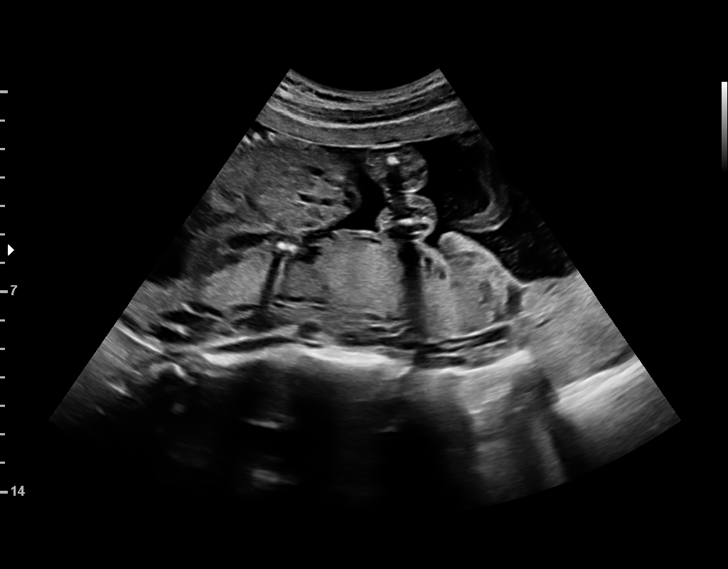
[im 81/109]
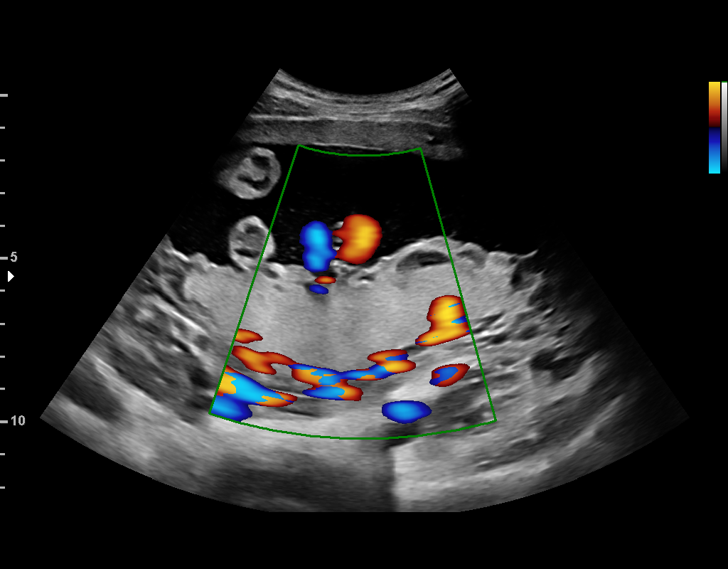
[im 89/109]
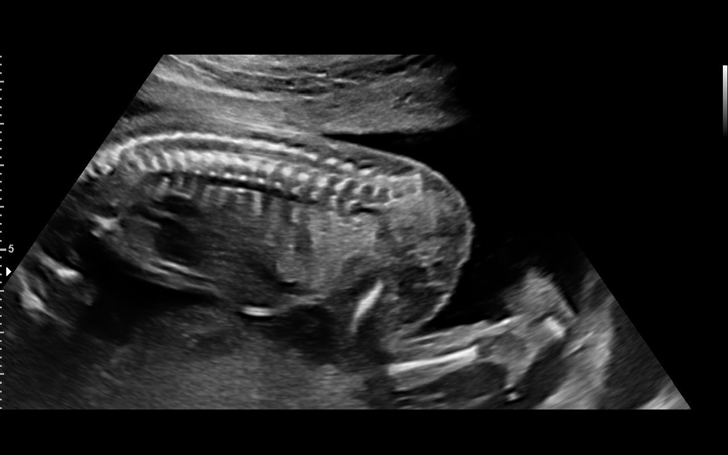
[im 97/109]
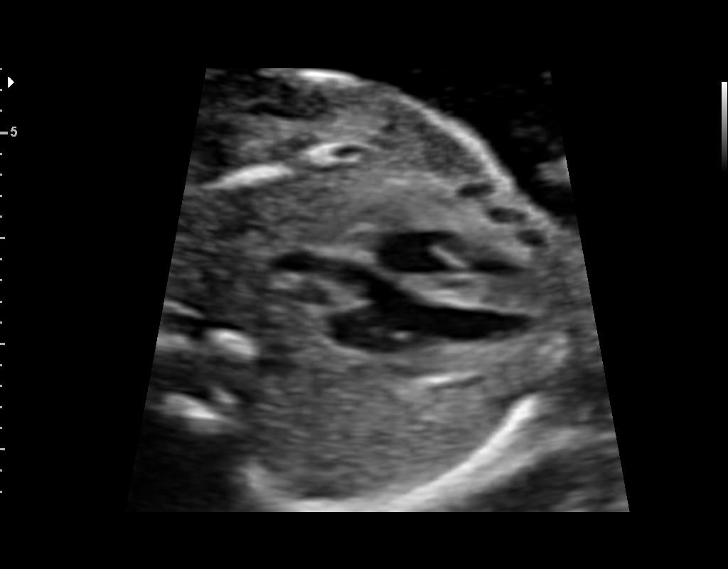
[im 105/109]
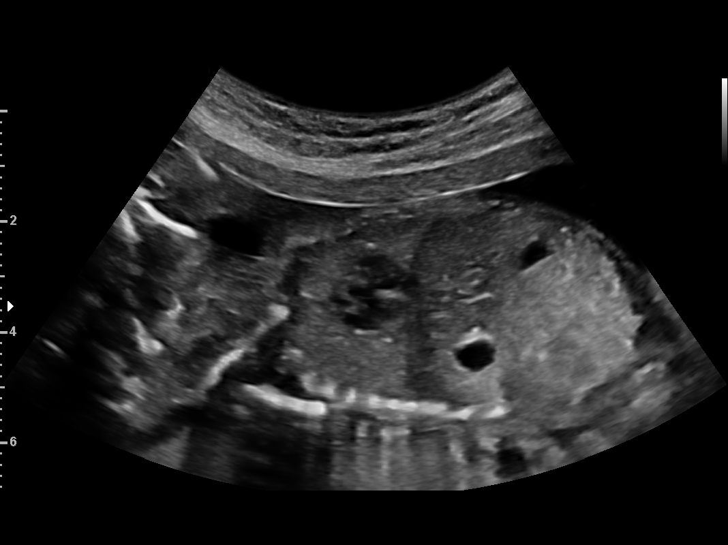

[13 of 28 positions shown; findings below may reference images not displayed]

Indications

Encounter for cervical length
18 weeks gestation of pregnancy
Encounter for antenatal screening for
malformations
Vital Signs

BMI:
Fetal Evaluation

Num Of Fetuses:         1
Fetal Heart Rate(bpm):  146
Cardiac Activity:       Observed
Presentation:           Breech
Placenta:               Posterior, low-lying, 1.7 cm from int os
P. Cord Insertion:      Visualized

Amniotic Fluid
AFI FV:      Within normal limits

Largest Pocket(cm)
5.13

Comment:    Clot seen at internal cervical os.
Biometry

BPD:      46.4  mm     G. Age:  20w 0d         91  %    CI:        74.98   %    70 - 86
FL/HC:      15.9   %    16.1 -
HC:       170   mm     G. Age:  19w 4d         77  %    HC/AC:      1.19        1.09 -
AC:      142.5  mm     G. Age:  19w 4d         70  %    FL/BPD:     58.4   %
FL:       27.1  mm     G. Age:  18w 2d         24  %    FL/AC:      19.0   %    20 - 24
HUM:      27.1  mm     G. Age:  18w 4d         45  %
CER:      20.6  mm     G. Age:  19w 4d         69  %
NFT:       4.5  mm

LV:        6.2  mm
CM:        4.9  mm

Est. FW:     273  gm    0 lb 10 oz      50  %
OB History

Gravidity:    2
TOP:          1
Gestational Age

LMP:           19w 5d        Date:  11/06/17                 EDD:   08/13/18
U/S Today:     19w 3d                                        EDD:   08/15/18
Best:          18w 6d     Det. By:  Early Ultrasound         EDD:   08/19/18
(12/28/17)
Anatomy

Cranium:               Appears normal         Aortic Arch:            Appears normal
Cavum:                 Appears normal         Ductal Arch:            Not well visualized
Ventricles:            Appears normal         Diaphragm:              Appears normal
Choroid Plexus:        Appears normal         Stomach:                Appears normal, left
sided
Cerebellum:            Appears normal         Abdomen:                Appears normal
Posterior Fossa:       Appears normal         Abdominal Wall:         Appears nml (cord
insert, abd wall)
Nuchal Fold:           Appears normal         Cord Vessels:           Appears normal (3
vessel cord)
Face:                  Appears normal         Kidneys:                Appear normal
(orbits and profile)
Lips:                  Appears normal         Bladder:                Appears normal
Thoracic:              Appears normal         Spine:                  Appears normal
Heart:                 Appears normal         Upper Extremities:      Appears normal
(4CH, axis, and situs
RVOT:                  Not well visualized    Lower Extremities:      Appears normal
LVOT:                  Appears normal
Cervix Uterus Adnexa

Cervix
Length:           2.97  cm.
Measured transvaginally. Appears closed, without funnelling.

Uterus
No abnormality visualized.

Left Ovary
Not visualized.

Right Ovary
Not visualized.
Impression

We performed fetal anatomy scan. No makers of
aneuploidies or fetal structural defects are seen. Fetal
biometry is consistent with her previously-established dates.
Amniotic fluid is normal and good fetal activity is seen.
Because of suspicion of placenta previa, we performed
transvaginal ultrasound. The cervix measures 3 cm, which is
normal. Placental edge is 1.7 cm from the internal os. An
echogenic free-floating mass just over the internal os that
could be an old blood clot. Patient gives history of vaginal
bleeding early in pregnancy.
Patient had opted not to screen for fetal aneuploidies.
Recommendations

An appointment was made for her to return in 4 weeks for
completion of fetal anatomy.

## 2019-01-03 IMAGING — US US MFM OB TRANSVAGINAL
1 series · 14 of 22 positions shown · non-contrast
Comparison: none

[Series 1: us mfm ob transvaginal · 22 acquisitions, 14 frames shown]
[im 1/22]
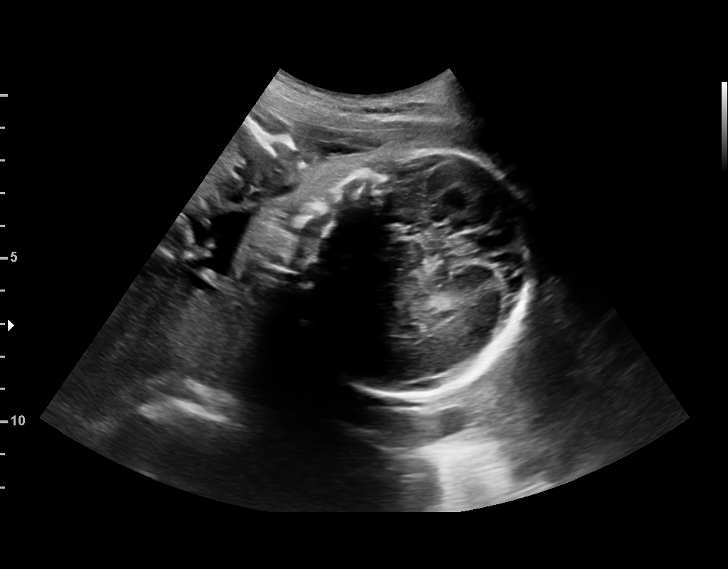
[im 3/22]
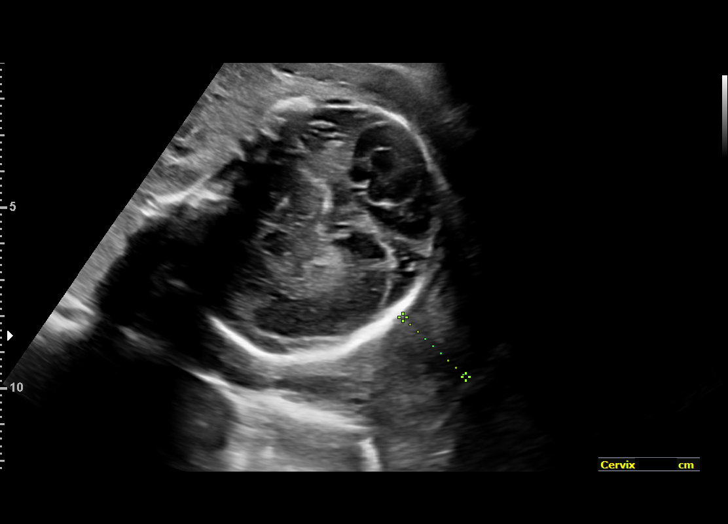
[im 4/22]
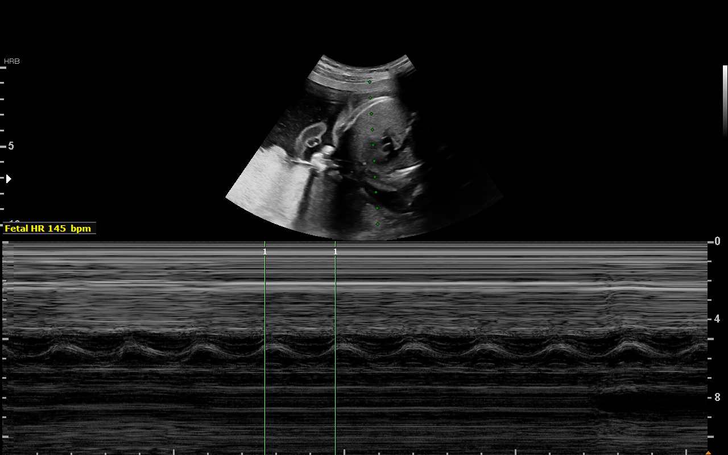
[im 6/22]
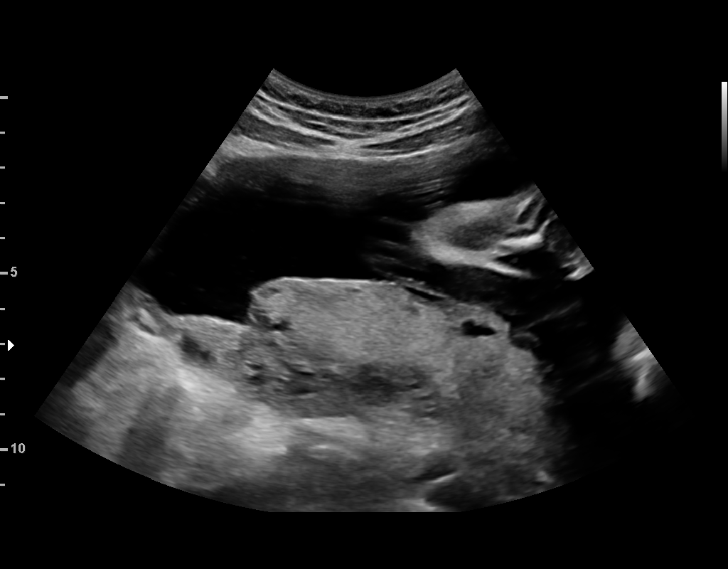
[im 8/22]
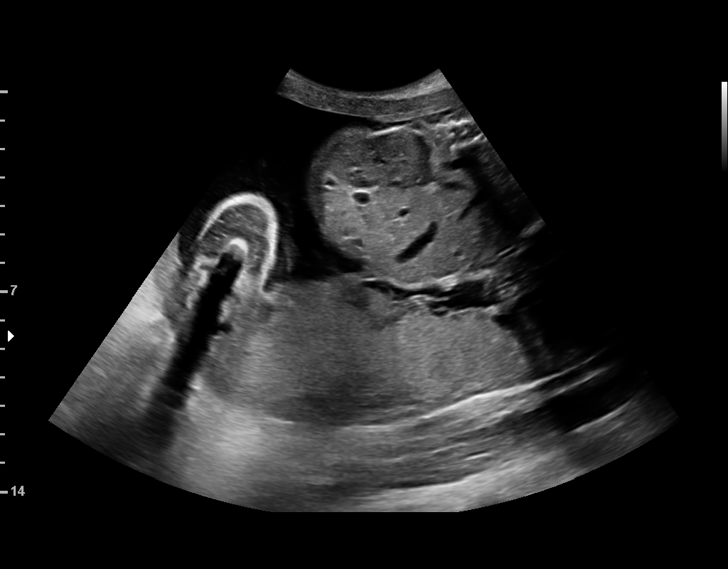
[im 9/22]
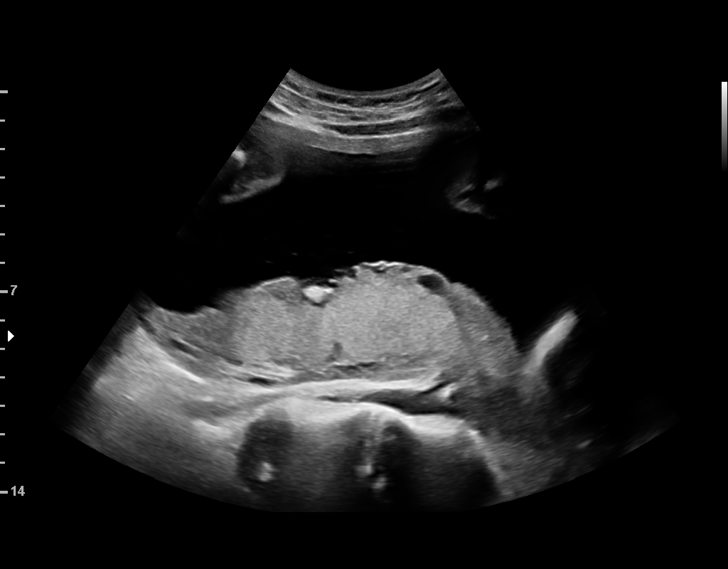
[im 11/22]
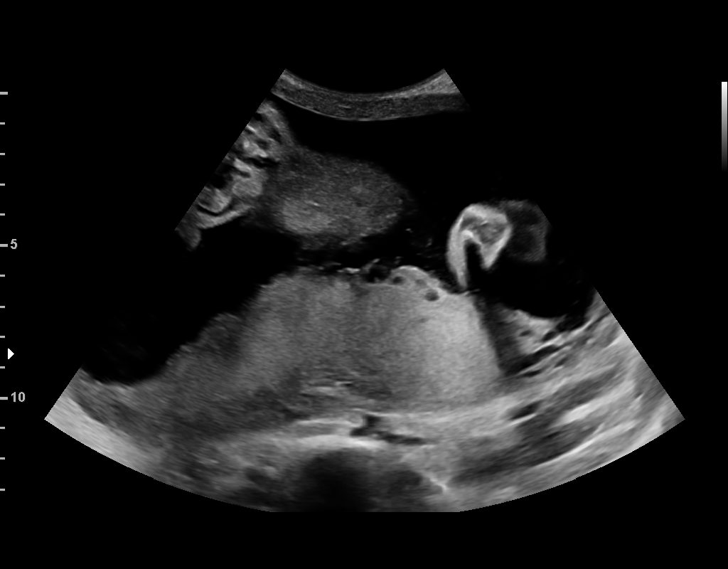
[im 12/22]
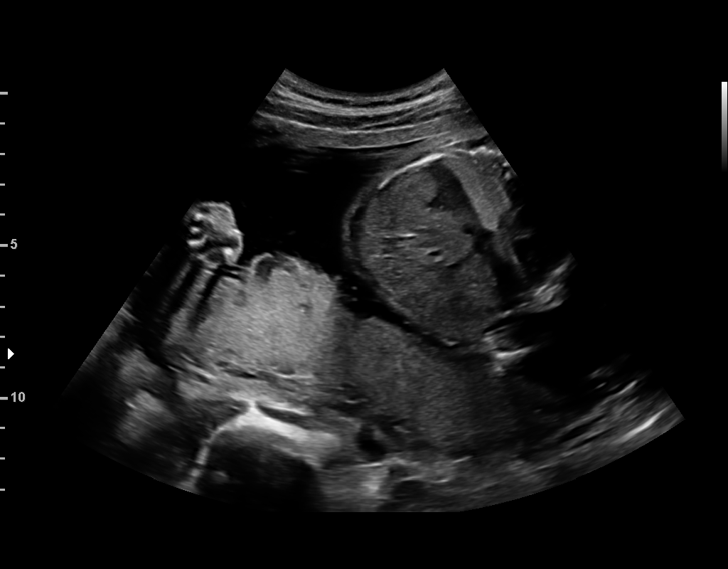
[im 14/22]
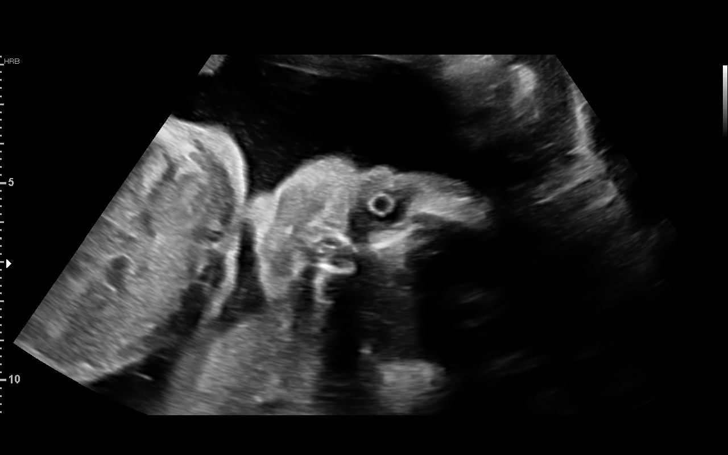
[im 15/22]
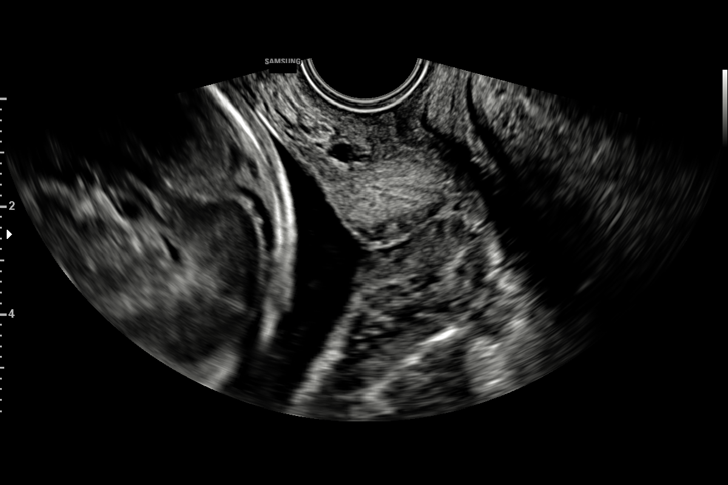
[im 17/22]
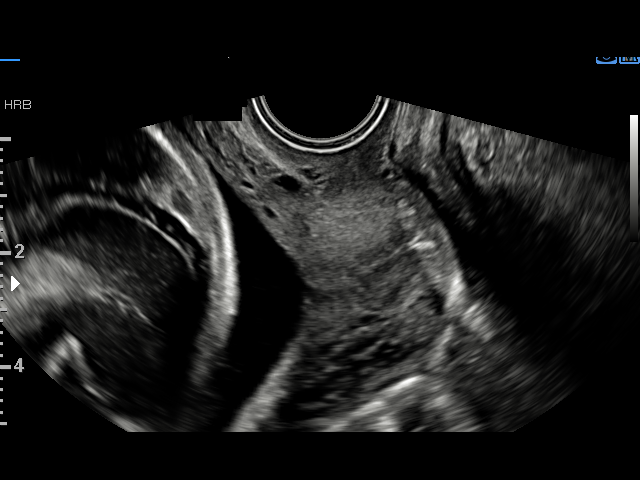
[im 19/22]
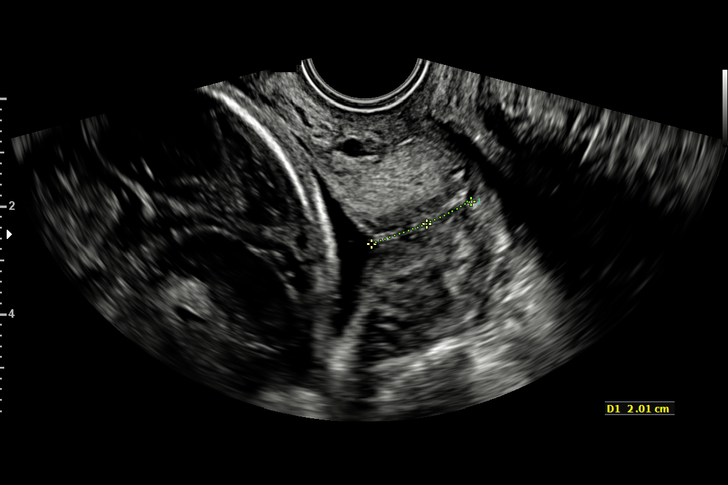
[im 20/22]
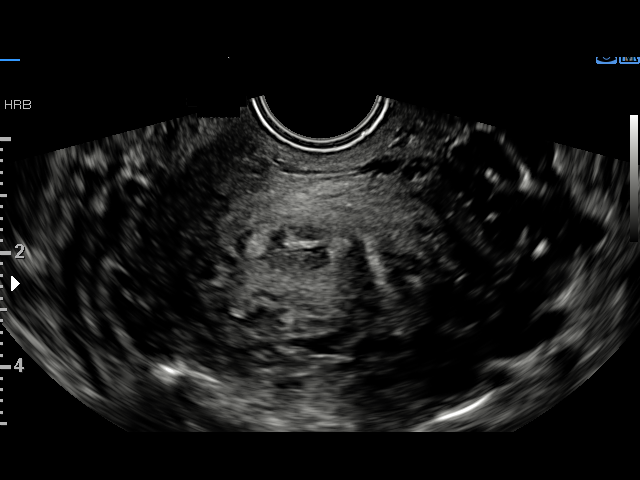
[im 22/22]
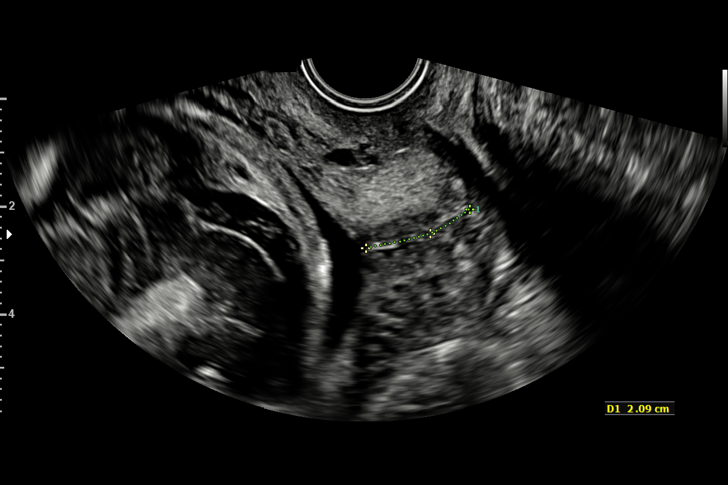

[14 of 22 positions shown; findings below may reference images not displayed]

Indications

24 weeks gestation of pregnancy
Antenatal follow-up for nonvisualized fetal
anatomy
Cervical shortening, second trimester
Low lying placenta, antepartum
Vital Signs

BMI:
Fetal Evaluation

Num Of Fetuses:         1
Fetal Heart Rate(bpm):  145
Cardiac Activity:       Observed
Presentation:           Cephalic
Placenta:               Posterior

Amniotic Fluid
AFI FV:      Within normal limits

Largest Pocket(cm)
4.96
OB History

Gravidity:    2
TOP:          1
Gestational Age

LMP:           25w 5d        Date:  11/06/17                 EDD:   08/13/18
Best:          24w 6d     Det. By:  Early Ultrasound         EDD:   08/19/18
(12/28/17)
Anatomy

Thoracic:              Appears normal
Cervix Uterus Adnexa

Cervix
Length:              2  cm.
Measured transvaginally.
Impression

Cervial incompetence. Patient is taking vaginal progesterone.
A limited ultrasound study was performed. Amniotic fluid is
normal and good fetal activity is seen. On transvaginal scan,
the cervix measures 2 cm, which is essentially-unchanged
from previous scan.

Low-lying placenta has resolved.

We reassured her of the findings.
Recommendations

Follow-up as clinically indicated.

## 2019-04-05 ENCOUNTER — Other Ambulatory Visit: Payer: Self-pay

## 2019-04-05 DIAGNOSIS — Z20822 Contact with and (suspected) exposure to covid-19: Secondary | ICD-10-CM

## 2019-04-06 LAB — NOVEL CORONAVIRUS, NAA: SARS-CoV-2, NAA: NOT DETECTED

## 2019-06-16 ENCOUNTER — Other Ambulatory Visit: Payer: Self-pay

## 2019-06-16 DIAGNOSIS — Z20822 Contact with and (suspected) exposure to covid-19: Secondary | ICD-10-CM

## 2019-06-17 LAB — NOVEL CORONAVIRUS, NAA: SARS-CoV-2, NAA: NOT DETECTED

## 2019-08-04 ENCOUNTER — Ambulatory Visit: Payer: Medicaid Other | Attending: Internal Medicine

## 2019-08-04 DIAGNOSIS — Z20822 Contact with and (suspected) exposure to covid-19: Secondary | ICD-10-CM

## 2019-08-05 LAB — NOVEL CORONAVIRUS, NAA: SARS-CoV-2, NAA: NOT DETECTED

## 2020-09-29 ENCOUNTER — Encounter (HOSPITAL_BASED_OUTPATIENT_CLINIC_OR_DEPARTMENT_OTHER): Payer: Self-pay | Admitting: Emergency Medicine

## 2020-09-29 ENCOUNTER — Other Ambulatory Visit: Payer: Self-pay

## 2020-09-29 ENCOUNTER — Emergency Department (HOSPITAL_BASED_OUTPATIENT_CLINIC_OR_DEPARTMENT_OTHER)
Admission: EM | Admit: 2020-09-29 | Discharge: 2020-09-29 | Disposition: A | Discharge status: Home / Self Care | Payer: Medicaid Other | Attending: Emergency Medicine | Admitting: Emergency Medicine

## 2020-09-29 DIAGNOSIS — F10921 Alcohol use, unspecified with intoxication delirium: Secondary | ICD-10-CM | POA: Diagnosis not present

## 2020-09-29 DIAGNOSIS — Z87891 Personal history of nicotine dependence: Secondary | ICD-10-CM | POA: Diagnosis not present

## 2020-09-29 DIAGNOSIS — F10920 Alcohol use, unspecified with intoxication, uncomplicated: Secondary | ICD-10-CM | POA: Diagnosis present

## 2020-09-29 DIAGNOSIS — Y907 Blood alcohol level of 200-239 mg/100 ml: Secondary | ICD-10-CM | POA: Insufficient documentation

## 2020-09-29 LAB — PREGNANCY, URINE: Preg Test, Ur: NEGATIVE

## 2020-09-29 LAB — RAPID URINE DRUG SCREEN, HOSP PERFORMED
Amphetamines: NOT DETECTED
Barbiturates: NOT DETECTED
Benzodiazepines: NOT DETECTED
Cocaine: NOT DETECTED
Opiates: NOT DETECTED
Tetrahydrocannabinol: NOT DETECTED

## 2020-09-29 LAB — ETHANOL: Alcohol, Ethyl (B): 230 mg/dL — ABNORMAL HIGH (ref <10)

## 2020-09-29 MED ORDER — ONDANSETRON HCL 4 MG/2ML IJ SOLN
4.0000 mg | Freq: Once | INTRAMUSCULAR | Status: AC
  Administered 2020-09-29: 4 mg via INTRAVENOUS
  Filled 2020-09-29: qty 2

## 2020-09-29 MED ORDER — PANTOPRAZOLE SODIUM 40 MG IV SOLR
40.0000 mg | Freq: Once | INTRAVENOUS | Status: AC
  Administered 2020-09-29: 40 mg via INTRAVENOUS
  Filled 2020-09-29: qty 40

## 2020-09-29 MED ORDER — SODIUM CHLORIDE 0.9 % IV BOLUS
1000.0000 mL | Freq: Once | INTRAVENOUS | Status: AC
  Administered 2020-09-29: 1000 mL via INTRAVENOUS

## 2020-09-29 NOTE — ED Notes (Signed)
Pt ambulated to BR without difficulty

## 2020-09-29 NOTE — ED Notes (Signed)
Attempted to call pt sister to pick her up; no answer, unable to leave voicemail; pt states she is the only one who can come get her; pt does not have cell phone

## 2020-09-29 NOTE — ED Notes (Signed)
Pt's sister arrived to take pt home.  IV's DC'd and pt ambulated down hall for DC.  All questions answered, pt verbalized understanding of discharge teaching.

## 2020-09-29 NOTE — ED Notes (Signed)
Pt says she is staying at the [facility] on [address]; charge RN aware of need for ride

## 2020-09-29 NOTE — ED Notes (Addendum)
Pt ambulated to bathroom, stand by assist; gait steady; EDP aware

## 2020-09-29 NOTE — ED Notes (Signed)
EDP at bedside  

## 2020-09-29 NOTE — ED Notes (Signed)
Judeth Cornfield with [facility] called to update RN.  "The little boy (Aune's son) is with her niece (a teenager) in the room.  Caran's sister is on the way to pick the 2 children up and will come pick Raeli up afterwards".  Charge RN updated.

## 2020-09-29 NOTE — ED Provider Notes (Signed)
[facility] Provider Note: Lowella Dell, MD, FACEP  CSN: [id number] MRN: [id number] ARRIVAL: 09/29/20 at 0214 ROOM: MH07/MH07   CHIEF COMPLAINT  Alcohol Intoxication  Level 5 caveat: Alcohol intoxication. HISTORY OF PRESENT ILLNESS  09/29/20 2:36 AM Christine Martin is a 30 y.o. female who was brought in by EMS for alcohol intoxication she.  She was drinking with friends in a hotel room and when she got to this degree of intoxication they called EMS.  She is only responsive to deep painful stimuli and she was assessed by nursing is having a GCS of 6.  She did vomit prior to arrival.  Her rectal temperature was noted to be 93.5 and she was started on a Bair Hugger warmer.   Past Medical History:  Diagnosis Date  . Anemia   . Hx of chlamydia infection   . Preterm infant    pt states she was born preterm but doesn't know how many weeks. Weighed 3 lbs    Past Surgical History:  Procedure Laterality Date  . THERAPEUTIC ABORTION  08/2017    Family History  Problem Relation Age of Onset  . Lupus Mother   . Rheum arthritis Mother     Social History   Tobacco Use  . Smoking status: Former Smoker    Quit date: 11/02/2017    Years since quitting: 2.9  . Smokeless tobacco: Never Used  . Tobacco comment: Marijuana previously  Vaping Use  . Vaping Use: Never used  Substance Use Topics  . Alcohol use: Not Currently    Comment: social  . Drug use: Not Currently    Types: Marijuana    Prior to Admission medications   Medication Sig Start Date End Date Taking? Authorizing Provider  Prenatal Vit-Fe Fumarate-FA (PREPLUS) 27-1 MG TABS Take 1 tablet by mouth daily.    [provider]    Allergies Patient has no known allergies.   REVIEW OF SYSTEMS  Cannot assess   PHYSICAL EXAMINATION  Initial Vital Signs Blood pressure 93/68, pulse 91, temperature (!) 93.5 F (34.2 C), temperature source Rectal, resp. rate 17, SpO2 100 %, currently  breastfeeding.  Examination General: Well-developed, well-nourished female in no acute distress; appearance consistent with age of record HENT: normocephalic; atraumatic Eyes: pupils equal, round and reactive to light; disconjugate, wandering gaze Neck: supple Heart: regular rate and rhythm Lungs: clear to auscultation bilaterally Abdomen: soft; nondistended; bowel sounds present Extremities: No deformity; full range of motion; pulses normal Neurologic: Somnolent; will grimace and withdraw to sternal rub; noted to move all extremities Skin: Warm and dry Psychiatric: Cannot assess   RESULTS  Summary of this visit's results, reviewed and interpreted by myself:   EKG Interpretation  Date/Time:    Ventricular Rate:    PR Interval:    QRS Duration:   QT Interval:    QTC Calculation:   R Axis:     Text Interpretation:        Laboratory Studies: Results for orders placed or performed during the hospital encounter of 09/29/20 (from the past 24 hour(s))  Ethanol     Status: Abnormal   Collection Time: 09/29/20  2:39 AM  Result Value Ref Range   Alcohol, Ethyl (B) 230 (H) <10 mg/dL  Rapid urine drug screen (hospital performed)     Status: None   Collection Time: 09/29/20  3:01 AM  Result Value Ref Range   Opiates NONE DETECTED NONE DETECTED   Cocaine NONE DETECTED NONE DETECTED   Benzodiazepines NONE  DETECTED NONE DETECTED   Amphetamines NONE DETECTED NONE DETECTED   Tetrahydrocannabinol NONE DETECTED NONE DETECTED   Barbiturates NONE DETECTED NONE DETECTED  Pregnancy, urine     Status: None   Collection Time: 09/29/20  3:01 AM  Result Value Ref Range   Preg Test, Ur NEGATIVE NEGATIVE   Imaging Studies: No results found.  ED COURSE and MDM  Nursing notes, initial and subsequent vitals signs, including pulse oximetry, reviewed and interpreted by myself.  Vitals:   09/29/20 0415 09/29/20 0445 09/29/20 0515 09/29/20 0537  BP: 94/64 (!) 90/55 (!) 91/56   Pulse: 83 87 88    Resp: 16 20 18    Temp:    97.9 F (36.6 C)  TempSrc:    Oral  SpO2: 99% 99% 98%    Medications  ondansetron (ZOFRAN) injection 4 mg (4 mg Intravenous Given 09/29/20 0303)  pantoprazole (PROTONIX) injection 40 mg (40 mg Intravenous Given 09/29/20 0303)  sodium chloride 0.9 % bolus 1,000 mL (0 mLs Intravenous Stopped 09/29/20 0428)   5:58 AM Patient now awake and able to answer questions.  She has been able to ambulate to the bathroom without difficulty.  She states her sister can come get her.   PROCEDURES  Procedures   ED DIAGNOSES     ICD-10-CM   1. Alcohol intoxication with delirium (HCC)  10/01/20        N82.956, MD 09/29/20 [phone]

## 2020-09-29 NOTE — ED Triage Notes (Addendum)
BIB EMS with alcohol intoxication. Response to deep painful stimuli at this time. Emesis in the field.

## 2020-09-29 NOTE — ED Notes (Signed)
Pt reporting she has a 29 yo son at the hotel; charge RN Val and Joretta Bachelor made aware, plans to call hotel staff and police; this RN re-checked pt belongings, did not find phone or card; belongings include jeans, underwear, shirt, ID, and car key fob

## 2020-09-29 NOTE — ED Notes (Signed)
Judeth Cornfield from [facility] [phone] will call with any updates moving forward.

## 2020-09-29 NOTE — ED Notes (Signed)
Report from Kaitlyn, RN  

## 2020-09-29 NOTE — ED Notes (Signed)
Pt informed previous RN of possibility of her 29yo son still being at the hotel and she is unsure if anyone is with him at this time.  [facility] notified and confirmed that pt does have a room at this hotel and she will check the room and call me back for an update.

## 2022-02-24 ENCOUNTER — Inpatient Hospital Stay (HOSPITAL_COMMUNITY): Payer: Medicaid Other

## 2022-02-24 ENCOUNTER — Encounter (HOSPITAL_COMMUNITY): Payer: Self-pay | Admitting: Obstetrics and Gynecology

## 2022-02-24 ENCOUNTER — Inpatient Hospital Stay (HOSPITAL_COMMUNITY)
Admission: AD | Admit: 2022-02-24 | Discharge: 2022-02-24 | Disposition: A | Discharge status: Home / Self Care | Payer: Medicaid Other | Attending: Obstetrics and Gynecology | Admitting: Obstetrics and Gynecology

## 2022-02-24 DIAGNOSIS — Z3201 Encounter for pregnancy test, result positive: Secondary | ICD-10-CM

## 2022-02-24 DIAGNOSIS — O209 Hemorrhage in early pregnancy, unspecified: Secondary | ICD-10-CM | POA: Diagnosis present

## 2022-02-24 DIAGNOSIS — Z6790 Unspecified blood type, Rh positive: Secondary | ICD-10-CM

## 2022-02-24 DIAGNOSIS — Z3A01 Less than 8 weeks gestation of pregnancy: Secondary | ICD-10-CM | POA: Diagnosis not present

## 2022-02-24 LAB — CBC
HCT: 36.5 % (ref 36.0–46.0)
Hemoglobin: 12.7 g/dL (ref 12.0–15.0)
MCH: 32.8 pg (ref 26.0–34.0)
MCHC: 34.8 g/dL (ref 30.0–36.0)
MCV: 94.3 fL (ref 80.0–100.0)
Platelets: 331 10*3/uL (ref 150–400)
RBC: 3.87 MIL/uL (ref 3.87–5.11)
RDW: 11.9 % (ref 11.5–15.5)
WBC: 5 10*3/uL (ref 4.0–10.5)
nRBC: 0 % (ref 0.0–0.2)

## 2022-02-24 LAB — HCG, QUANTITATIVE, PREGNANCY: hCG, Beta Chain, Quant, S: 20390 m[IU]/mL — ABNORMAL HIGH (ref <5)

## 2022-02-24 NOTE — MAU Note (Signed)
Christine Martin is a 31 y.o. at  approx 6 wks here in MAU reporting: has been bleeding for 3 wks, thought it was maybe her period.  Went to UC today and they said she was preg, unsure if miscarriage from the bleeding or what. No pain. LMP: 2nd wk of July. Changes 2-3 times a day, pad is not soaked.  Onset of complaint: 3 wks ago Pain score: none Vitals:   02/24/22 1415  BP: 117/68  Pulse: 80  Resp: 16  Temp: 98.5 F (36.9 C)  SpO2: 100%      Lab orders placed from triage:   Has paperwork from office, statin she had a +preg test.  Will make copy and send to med records.

## 2022-02-24 NOTE — MAU Provider Note (Signed)
History     CSN: [id number]  Arrival date and time: 02/24/22 1338   Event Date/Time   First Provider Initiated Contact with Patient 02/24/22 1625      Chief Complaint  Patient presents with   Vaginal Bleeding   Possible Pregnancy   HPI Christine Martin is a 31 y.o. G3P1011 at [redacted]w[redacted]d  by LMP who presents to MAU with chief complaint of vaginal bleeding. Patient states she has been bleeding for three weeks. She denies weakness, dizziness or syncope. She had her pregnancy confirmed at Urgent Care and was advised to pursue additional evaluation in MAU. She denies pain, dysuria, fever or recent illness.  Patient is accompanied by her 28 year old son. She does not have child care but may be able to have a relative watch her son long enough for ultrasound to be accomplished.  OB History     Gravida  3   Para  1   Term  1   Preterm      AB  1   Living  1      SAB      IAB  1   Ectopic      Multiple  0   Live Births  1           Past Medical History:  Diagnosis Date   Anemia    Hx of chlamydia infection    Preterm infant    pt states she was born preterm but doesn't know how many weeks. Weighed 3 lbs    Past Surgical History:  Procedure Laterality Date   THERAPEUTIC ABORTION  08/2017    Family History  Problem Relation Age of Onset   Lupus Mother    Rheum arthritis Mother     Social History   Tobacco Use   Smoking status: Former    Types: Cigarettes    Quit date: 11/02/2017    Years since quitting: 4.3   Smokeless tobacco: Never   Tobacco comments:    Marijuana previously  Vaping Use   Vaping Use: Never used  Substance Use Topics   Alcohol use: Not Currently    Comment: social   Drug use: Not Currently    Types: Marijuana    Allergies: No Known Allergies  Medications Prior to Admission  Medication Sig Dispense Refill Last Dose   Prenatal Vit-Fe Fumarate-FA (PREPLUS) 27-1 MG TABS Take 1 tablet by mouth daily.       Review of Systems   Genitourinary:  Positive for vaginal bleeding.  All other systems reviewed and are negative.  Physical Exam   Blood pressure 117/68, pulse 80, temperature 98.5 F (36.9 C), temperature source Oral, resp. rate 16, height 5\' 1"  (1.549 m), weight 55.2 kg, last menstrual period 01/13/2022, SpO2 100 %, currently breastfeeding.  Physical Exam Vitals and nursing note reviewed. Exam conducted with a chaperone present.  Constitutional:      Appearance: Normal appearance.  Cardiovascular:     Rate and Rhythm: Normal rate and regular rhythm.     Pulses: Normal pulses.     Heart sounds: Normal heart sounds.  Pulmonary:     Effort: Pulmonary effort is normal.     Breath sounds: Normal breath sounds.  Skin:    Capillary Refill: Capillary refill takes less than 2 seconds.  Neurological:     Mental Status: She is alert.  Psychiatric:        Mood and Affect: Mood normal.        Behavior: Behavior  normal.        Thought Content: Thought content normal.        Judgment: Judgment normal.     MAU Course  Procedures  MDM Orders Placed This Encounter  Procedures   US OB LESS THAN 14 WEEKS WITH OB TRANSVAGINAL   US OB Transvaginal   CBC   hCG, quantitative, pregnancy   Discharge patient   Patient Vitals for the past 24 hrs:  BP Temp Temp src Pulse Resp SpO2 Height Weight  02/24/22 1415 117/68 98.5 F (36.9 C) Oral 80 16 100 % 5\' 1"  (1.549 m) 55.2 kg   Results for orders placed or performed during the hospital encounter of 02/24/22 (from the past 24 hour(s))  CBC     Status: None   Collection Time: 02/24/22  2:42 PM  Result Value Ref Range   WBC 5.0 4.0 - 10.5 K/uL   RBC 3.87 3.87 - 5.11 MIL/uL   Hemoglobin 12.7 12.0 - 15.0 g/dL   HCT 02/26/22 09.6 - 28.3 %   MCV 94.3 80.0 - 100.0 fL   MCH 32.8 26.0 - 34.0 pg   MCHC 34.8 30.0 - 36.0 g/dL   RDW 66.2 94.7 - 65.4 %   Platelets 331 150 - 400 K/uL   nRBC 0.0 0.0 - 0.2 %  hCG, quantitative, pregnancy     Status: Abnormal   Collection  Time: 02/24/22  2:42 PM  Result Value Ref Range   hCG, Beta Chain, Quant, S 20,390 (H) <5 mIU/mL   02/26/22 OB LESS THAN 14 WEEKS WITH OB TRANSVAGINAL  Result Date: 02/24/2022 CLINICAL DATA:  Vaginal bleeding. EXAM: OBSTETRIC <14 WK 02/26/2022 AND TRANSVAGINAL OB US TECHNIQUE: Both transabdominal and transvaginal ultrasound examinations were performed for complete evaluation of the gestation as well as the maternal uterus, adnexal regions, and pelvic cul-de-sac. Transvaginal technique was performed to assess early pregnancy. COMPARISON:  None Available. FINDINGS: Intrauterine gestational sac: Single Yolk sac:  Not Visualized. Embryo:  Not Visualized. Cardiac Activity: Not Visualized. MSD: 12.4 mm   6 w   0 d Subchorionic hemorrhage:  None visualized. Maternal uterus/adnexae: Right ovary is unremarkable. Left ovary is not visualized. No free fluid is noted. IMPRESSION: Probable early intrauterine gestational sac, but no yolk sac, fetal pole, or cardiac activity yet visualized. Recommend follow-up quantitative B-HCG levels and follow-up US in 14 days to assess viability. This recommendation follows SRU consensus guidelines: Diagnostic Criteria for Nonviable Pregnancy Early in the First Trimester. [place] Med [id number]. Electronically Signed   By: ; [id number] M.D.   On: 02/24/2022 16:17     Assessment and Plan  --31 y.o. G3P1011 with + GS on formal 37 --Vaginal bleeding, blood type O POS --Hgb 12.7 --Discharge home in stable condition with first trimester precautions  F/U: Order placed for repeat ultrasound at [facility] in two weeks  [facility], MSA, MSN, CNM 02/24/2022, 7:52 PM

## 2022-02-24 NOTE — Discharge Instructions (Signed)

## 2022-03-06 ENCOUNTER — Inpatient Hospital Stay (HOSPITAL_COMMUNITY)
Admission: AD | Admit: 2022-03-06 | Discharge: 2022-03-06 | Disposition: A | Discharge status: Home / Self Care | Payer: Medicaid Other | Attending: Obstetrics & Gynecology | Admitting: Obstetrics & Gynecology

## 2022-03-06 ENCOUNTER — Inpatient Hospital Stay (HOSPITAL_COMMUNITY): Payer: Medicaid Other

## 2022-03-06 DIAGNOSIS — O208 Other hemorrhage in early pregnancy: Secondary | ICD-10-CM | POA: Diagnosis not present

## 2022-03-06 DIAGNOSIS — Z3A01 Less than 8 weeks gestation of pregnancy: Secondary | ICD-10-CM | POA: Diagnosis not present

## 2022-03-06 DIAGNOSIS — O039 Complete or unspecified spontaneous abortion without complication: Secondary | ICD-10-CM | POA: Diagnosis not present

## 2022-03-06 DIAGNOSIS — Z3A09 9 weeks gestation of pregnancy: Secondary | ICD-10-CM | POA: Diagnosis not present

## 2022-03-06 DIAGNOSIS — O209 Hemorrhage in early pregnancy, unspecified: Secondary | ICD-10-CM | POA: Diagnosis not present

## 2022-03-06 DIAGNOSIS — O26891 Other specified pregnancy related conditions, first trimester: Secondary | ICD-10-CM | POA: Diagnosis not present

## 2022-03-06 LAB — URINALYSIS, ROUTINE W REFLEX MICROSCOPIC
Bilirubin Urine: NEGATIVE
Glucose, UA: NEGATIVE mg/dL
Ketones, ur: NEGATIVE mg/dL
Nitrite: NEGATIVE
Protein, ur: NEGATIVE mg/dL
RBC / HPF: >50 RBC/hpf — ABNORMAL HIGH (ref 0–5)
Specific Gravity, Urine: 1.013 (ref 1.005–1.030)
pH: 7 (ref 5.0–8.0)

## 2022-03-06 LAB — COMPREHENSIVE METABOLIC PANEL
ALT: 13 U/L (ref 0–44)
AST: 15 U/L (ref 15–41)
Albumin: 4.1 g/dL (ref 3.5–5.0)
Alkaline Phosphatase: 45 U/L (ref 38–126)
Anion gap: 6 (ref 5–15)
BUN: 8 mg/dL (ref 6–20)
CO2: 27 mmol/L (ref 22–32)
Calcium: 9.9 mg/dL (ref 8.9–10.3)
Chloride: 106 mmol/L (ref 98–111)
Creatinine, Ser: 0.71 mg/dL (ref 0.44–1.00)
GFR, Estimated: >60 mL/min (ref >60)
Glucose, Bld: 90 mg/dL (ref 70–99)
Potassium: 4 mmol/L (ref 3.5–5.1)
Sodium: 139 mmol/L (ref 135–145)
Total Bilirubin: 0.4 mg/dL (ref 0.3–1.2)
Total Protein: 7.4 g/dL (ref 6.5–8.1)

## 2022-03-06 LAB — CBC
HCT: 37.4 % (ref 36.0–46.0)
Hemoglobin: 13.2 g/dL (ref 12.0–15.0)
MCH: 33.2 pg (ref 26.0–34.0)
MCHC: 35.3 g/dL (ref 30.0–36.0)
MCV: 94.2 fL (ref 80.0–100.0)
Platelets: 325 10*3/uL (ref 150–400)
RBC: 3.97 MIL/uL (ref 3.87–5.11)
RDW: 11.9 % (ref 11.5–15.5)
WBC: 5.2 10*3/uL (ref 4.0–10.5)
nRBC: 0 % (ref 0.0–0.2)

## 2022-03-06 LAB — HCG, QUANTITATIVE, PREGNANCY: hCG, Beta Chain, Quant, S: 3720 m[IU]/mL — ABNORMAL HIGH (ref <5)

## 2022-03-06 MED ORDER — MISOPROSTOL 200 MCG PO TABS: ORAL_TABLET | ORAL | 0 refills | Status: AC

## 2022-03-06 MED ORDER — ONDANSETRON HCL 8 MG PO TABS: 8.0000 mg | ORAL_TABLET | Freq: Three times a day (TID) | ORAL | 0 refills | Status: AC | PRN

## 2022-03-06 MED ORDER — ACETAMINOPHEN-CODEINE 300-30 MG PO TABS: 1.0000 | ORAL_TABLET | Freq: Four times a day (QID) | ORAL | 0 refills | Status: AC | PRN

## 2022-03-06 NOTE — Discharge Instructions (Signed)

## 2022-03-06 NOTE — MAU Provider Note (Signed)
Patient Christine Martin is a 31 y.o. G3P1011  At [redacted]w[redacted]d here with complaints of continued heavy bleeding that has been on-going heavy bleeding since she was in MAU on 8/22. She denies diarrhea, fever, SOB, dyspnea, chest pain, nausea, vomiting.  History     CSN: [id number]  Arrival date and time: 03/06/22 1754   None     Chief Complaint  Patient presents with  . Vaginal Bleeding   Vaginal Bleeding The patient's primary symptoms include vaginal bleeding. This is a recurrent problem. The current episode started 1 to 4 weeks ago. The problem occurs constantly. The problem has been gradually worsening. Associated symptoms include abdominal pain. Pertinent negatives include no chills, constipation, diarrhea, dysuria, fever, urgency or vomiting. The vaginal discharge was bloody. The vaginal bleeding is typical of menses. She has not been passing clots.  Abdominal Pain This is a new problem. The current episode started 1 to 4 weeks ago. The pain is located in the generalized abdominal region. The pain is at a severity of 5/10. The abdominal pain radiates to the back. Pertinent negatives include no constipation, diarrhea, dysuria, fever or vomiting.    OB History     Gravida  3   Para  1   Term  1   Preterm      AB  1   Living  1      SAB      IAB  1   Ectopic      Multiple  0   Live Births  1           Past Medical History:  Diagnosis Date  . Anemia   . Hx of chlamydia infection   . Preterm infant    pt states she was born preterm but doesn't know how many weeks. Weighed 3 lbs    Past Surgical History:  Procedure Laterality Date  . THERAPEUTIC ABORTION  08/2017    Family History  Problem Relation Age of Onset  . Lupus Mother   . Rheum arthritis Mother     Social History   Tobacco Use  . Smoking status: Former    Types: Cigarettes    Quit date: 11/02/2017    Years since quitting: 4.3  . Smokeless tobacco: Never  . Tobacco comments:    Marijuana  previously  Vaping Use  . Vaping Use: Never used  Substance Use Topics  . Alcohol use: Not Currently    Comment: social  . Drug use: Not Currently    Types: Marijuana    Allergies: No Known Allergies  Medications Prior to Admission  Medication Sig Dispense Refill Last Dose  . Prenatal Vit-Fe Fumarate-FA (PREPLUS) 27-1 MG TABS Take 1 tablet by mouth daily.       Review of Systems  Constitutional: Negative.  Negative for chills and fever.  HENT: Negative.    Respiratory: Negative.    Cardiovascular: Negative.   Gastrointestinal:  Positive for abdominal pain. Negative for constipation, diarrhea and vomiting.  Genitourinary:  Positive for vaginal bleeding. Negative for dysuria and urgency.  Neurological: Negative.   Hematological: Negative.    Physical Exam   Blood pressure 116/69, pulse 78, temperature 97.8 F (36.6 C), resp. rate 18, height 5\' 1"  (1.549 m), weight 54 kg, last menstrual period 01/13/2022, currently breastfeeding.  Physical Exam  MAU Course  Procedures  MDM ***  Assessment and Plan  ***  03/16/2022 [facility] 03/06/2022, 7:09 PM

## 2022-03-06 NOTE — MAU Note (Signed)
.  Christine Martin is a 31 y.o. at [redacted]w[redacted]d here in MAU reporting: c/o vag bleeding and cramping for the past few days. Was seen in MAU for same complaint on 8/22. LMP:  Onset of complaint: 2-3 days Pain score: 5 Vitals:   03/06/22 1805  BP: 116/69  Pulse: 78  Resp: 18  Temp: 97.8 F (36.6 C)     FHT: Lab orders placed from triage:

## 2022-03-30 ENCOUNTER — Other Ambulatory Visit: Payer: Self-pay

## 2022-03-30 ENCOUNTER — Ambulatory Visit (INDEPENDENT_AMBULATORY_CARE_PROVIDER_SITE_OTHER): Payer: 59

## 2022-03-30 VITALS — BP 116/79 | HR 70 | Ht 60.0 in | Wt 117.3 lb

## 2022-03-30 DIAGNOSIS — O039 Complete or unspecified spontaneous abortion without complication: Secondary | ICD-10-CM | POA: Diagnosis not present

## 2022-03-30 NOTE — Progress Notes (Signed)
   GYNECOLOGY PROGRESS NOTE  History:  31 y.o. G3P1011 presents to [facility] office today for follow up after SAB She reports she has had continuous bleeding since she took cytotec on 9/1. Bleeding is small, having to wear a small pad and change 2-3x per day. She reports she passed some clots after taking  Cytotec, however has not passed any since. She denies pain, fever, dizziness, chest pain, or SOB. No vaginal itching/odor.   The following portions of the patient's history were reviewed and updated as appropriate: allergies, current medications, past family history, past medical history, past social history, past surgical history and problem list. Last pap smear on 06/18/2021 was NILM.  Health Maintenance Due  Topic Date Due   COVID-19 Vaccine (1) Never done   Hepatitis C Screening  Never done   PAP-Cervical Cytology Screening  01/31/2021   PAP SMEAR-Modifier  01/31/2021   INFLUENZA VACCINE  Never done     Review of Systems:  Pertinent items are noted in HPI.   Objective:  Physical Exam Blood pressure 116/79, pulse 70, height 5' (1.524 m), weight 117 lb 4.8 oz (53.2 kg), last menstrual period 01/13/2022, unknown if currently breastfeeding. VS reviewed, nursing note reviewed,  Constitutional: well developed, well nourished, no distress HEENT: normocephalic CV: normal rate Pulm/chest wall: normal effort Breast Exam: deferred Abdomen: soft Neuro: alert and oriented x 3 Skin: warm, dry Psych: affect normal Pelvic exam: not performed  Assessment & Plan:  1. Miscarriage - Continued bleeding. More irritating per patient. Will order bHCG today. Will order ultrasound if bHCG remains elevated. Trend bHCG until negative. - Declines contraception - Beta hCG quant (ref lab)    No follow-ups on file.   Renee Harder, CNM 2:47 PM

## 2022-03-30 NOTE — Progress Notes (Signed)
Pt reports moderate bleeding.

## 2022-03-31 LAB — BETA HCG QUANT (REF LAB): hCG Quant: 98 m[IU]/mL

## 2022-04-01 ENCOUNTER — Telehealth: Payer: Self-pay | Admitting: General Practice

## 2022-04-01 NOTE — Telephone Encounter (Signed)
-----   Message from Renee Harder, CNM sent at 04/01/2022 10:20 AM EDT ----- Please schedule patient for repeat bHCG in 1 week.   Thanks! Andee Poles

## 2022-04-01 NOTE — Telephone Encounter (Signed)
Called patient, no answer- unable to leave message due to no voicemail. Will send mychart message

## 2022-04-06 ENCOUNTER — Other Ambulatory Visit: Payer: Self-pay | Admitting: General Practice

## 2022-04-06 DIAGNOSIS — O039 Complete or unspecified spontaneous abortion without complication: Secondary | ICD-10-CM

## 2022-04-08 ENCOUNTER — Other Ambulatory Visit: Payer: Self-pay

## 2022-04-08 ENCOUNTER — Other Ambulatory Visit: Payer: 59

## 2022-04-08 DIAGNOSIS — O039 Complete or unspecified spontaneous abortion without complication: Secondary | ICD-10-CM | POA: Diagnosis not present

## 2022-04-09 LAB — BETA HCG QUANT (REF LAB): hCG Quant: 49 m[IU]/mL

## 2022-04-10 ENCOUNTER — Telehealth: Payer: Self-pay

## 2022-04-10 NOTE — Telephone Encounter (Addendum)
-----   Message from Renee Harder, CNM sent at 04/10/2022  8:13 AM EDT ----- Patient needs to have another bHCG in 1 week   Thanks! Danielle  ------------  Called pt; number not valid. MyChart message sent.

## 2022-04-13 ENCOUNTER — Other Ambulatory Visit: Payer: Self-pay | Admitting: General Practice

## 2022-04-13 DIAGNOSIS — O039 Complete or unspecified spontaneous abortion without complication: Secondary | ICD-10-CM

## 2022-04-15 ENCOUNTER — Other Ambulatory Visit: Payer: 59

## 2022-04-15 ENCOUNTER — Other Ambulatory Visit: Payer: Self-pay

## 2022-04-15 DIAGNOSIS — O039 Complete or unspecified spontaneous abortion without complication: Secondary | ICD-10-CM

## 2022-04-16 LAB — BETA HCG QUANT (REF LAB): hCG Quant: 29 m[IU]/mL

## 2022-04-20 ENCOUNTER — Encounter: Payer: Self-pay | Admitting: *Deleted

## 2022-04-20 ENCOUNTER — Telehealth: Payer: Self-pay | Admitting: *Deleted

## 2022-04-20 DIAGNOSIS — O039 Complete or unspecified spontaneous abortion without complication: Secondary | ICD-10-CM

## 2022-04-20 NOTE — Telephone Encounter (Addendum)
-----   Message from Renee Harder, CNM sent at 04/18/2022 10:11 PM EDT ----- Patient needs hcg in 1 week  Thanks! Danielle  10/16  1605  Called pt and she did not answer - unable to leave voicemail message. Mychart message sent to pt stating that a follow up lab appt has been scheduled for 10/18 @ 3:30 pm.

## 2022-04-22 ENCOUNTER — Other Ambulatory Visit: Payer: Self-pay

## 2022-04-22 ENCOUNTER — Other Ambulatory Visit: Payer: 59

## 2022-04-22 ENCOUNTER — Other Ambulatory Visit: Payer: Self-pay | Admitting: General Practice

## 2022-04-22 DIAGNOSIS — O039 Complete or unspecified spontaneous abortion without complication: Secondary | ICD-10-CM

## 2022-04-23 LAB — BETA HCG QUANT (REF LAB): hCG Quant: 24 m[IU]/mL

## 2022-04-29 ENCOUNTER — Other Ambulatory Visit: Payer: 59

## 2022-04-29 ENCOUNTER — Other Ambulatory Visit: Payer: Self-pay | Admitting: General Practice

## 2022-04-29 ENCOUNTER — Other Ambulatory Visit: Payer: Self-pay

## 2022-04-29 DIAGNOSIS — O039 Complete or unspecified spontaneous abortion without complication: Secondary | ICD-10-CM

## 2022-04-30 LAB — BETA HCG QUANT (REF LAB): hCG Quant: 24 m[IU]/mL

## 2022-05-07 ENCOUNTER — Encounter: Payer: Self-pay | Admitting: *Deleted

## 2022-05-07 ENCOUNTER — Telehealth: Payer: Self-pay | Admitting: *Deleted

## 2022-05-07 NOTE — Telephone Encounter (Signed)
I called [facility] and heard a message " your call cannot be completed at this time" I will send  a MyChart message and also leave this in inbox until patient responds or we call her back. Staci Acosta

## 2022-05-07 NOTE — Telephone Encounter (Signed)
-----   Message from Renee Harder, CNM sent at 05/07/2022 10:30 AM EDT ----- Patient needs follow up bhcg

## 2022-05-08 ENCOUNTER — Other Ambulatory Visit: Payer: Self-pay | Admitting: *Deleted

## 2022-05-08 ENCOUNTER — Other Ambulatory Visit: Payer: 59

## 2022-05-08 DIAGNOSIS — O039 Complete or unspecified spontaneous abortion without complication: Secondary | ICD-10-CM

## 2022-05-08 NOTE — Telephone Encounter (Signed)
Spoke with patient via mychart.  Colletta [place], RNC

## 2023-04-08 DIAGNOSIS — R102 Pelvic and perineal pain: Secondary | ICD-10-CM | POA: Diagnosis not present

## 2023-04-08 DIAGNOSIS — N898 Other specified noninflammatory disorders of vagina: Secondary | ICD-10-CM | POA: Diagnosis not present

## 2023-04-08 DIAGNOSIS — O26891 Other specified pregnancy related conditions, first trimester: Secondary | ICD-10-CM | POA: Diagnosis not present

## 2023-04-15 DIAGNOSIS — O26891 Other specified pregnancy related conditions, first trimester: Secondary | ICD-10-CM | POA: Diagnosis not present

## 2023-04-15 DIAGNOSIS — R109 Unspecified abdominal pain: Secondary | ICD-10-CM | POA: Diagnosis not present

## 2023-04-15 DIAGNOSIS — R1084 Generalized abdominal pain: Secondary | ICD-10-CM | POA: Diagnosis not present

## 2023-04-15 DIAGNOSIS — Z3A01 Less than 8 weeks gestation of pregnancy: Secondary | ICD-10-CM | POA: Diagnosis not present

## 2023-04-15 DIAGNOSIS — Z3A08 8 weeks gestation of pregnancy: Secondary | ICD-10-CM | POA: Diagnosis not present

## 2023-05-26 DIAGNOSIS — N939 Abnormal uterine and vaginal bleeding, unspecified: Secondary | ICD-10-CM | POA: Diagnosis not present

## 2023-05-26 DIAGNOSIS — Z3A14 14 weeks gestation of pregnancy: Secondary | ICD-10-CM | POA: Diagnosis not present

## 2023-05-26 DIAGNOSIS — O4412 Placenta previa with hemorrhage, second trimester: Secondary | ICD-10-CM | POA: Diagnosis not present

## 2023-05-26 DIAGNOSIS — O209 Hemorrhage in early pregnancy, unspecified: Secondary | ICD-10-CM | POA: Diagnosis not present

## 2023-05-26 DIAGNOSIS — O4402 Placenta previa specified as without hemorrhage, second trimester: Secondary | ICD-10-CM | POA: Diagnosis not present
